# Patient Record
Sex: Female | Born: 1990 | Race: Black or African American | Hispanic: No | Marital: Single | Smoking: Never smoker
Health system: Southern US, Community
[De-identification: ages and names within clinical notes are randomized; demographics above are authoritative.]

## PROBLEM LIST (undated history)

## (undated) ENCOUNTER — Inpatient Hospital Stay (HOSPITAL_COMMUNITY): Payer: Self-pay

## (undated) DIAGNOSIS — K219 Gastro-esophageal reflux disease without esophagitis: Secondary | ICD-10-CM

## (undated) HISTORY — PX: NO PAST SURGERIES: SHX2092

---
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
## 2014-06-29 ENCOUNTER — Ambulatory Visit (INDEPENDENT_AMBULATORY_CARE_PROVIDER_SITE_OTHER): Payer: BC Managed Care – HMO | Admitting: Family Medicine

## 2014-06-29 ENCOUNTER — Encounter: Payer: Self-pay | Admitting: Family Medicine

## 2014-06-29 VITALS — BP 98/62 | HR 93 | Temp 99.4°F | Resp 16 | Ht 59.5 in | Wt 115.8 lb

## 2014-06-29 DIAGNOSIS — R109 Unspecified abdominal pain: Secondary | ICD-10-CM

## 2014-06-29 DIAGNOSIS — N898 Other specified noninflammatory disorders of vagina: Secondary | ICD-10-CM

## 2014-06-29 DIAGNOSIS — R350 Frequency of micturition: Secondary | ICD-10-CM

## 2014-06-29 DIAGNOSIS — B9689 Other specified bacterial agents as the cause of diseases classified elsewhere: Secondary | ICD-10-CM

## 2014-06-29 DIAGNOSIS — N760 Acute vaginitis: Secondary | ICD-10-CM

## 2014-06-29 DIAGNOSIS — A499 Bacterial infection, unspecified: Secondary | ICD-10-CM

## 2014-06-29 DIAGNOSIS — N390 Urinary tract infection, site not specified: Secondary | ICD-10-CM

## 2014-06-29 DIAGNOSIS — Z7251 High risk heterosexual behavior: Secondary | ICD-10-CM

## 2014-06-29 DIAGNOSIS — Z0000 Encounter for general adult medical examination without abnormal findings: Secondary | ICD-10-CM | POA: Insufficient documentation

## 2014-06-29 LAB — POCT WET PREP WITH KOH
KOH Prep POC: NEGATIVE
TRICHOMONAS UA: NEGATIVE
Yeast Wet Prep HPF POC: NEGATIVE

## 2014-06-29 LAB — POCT URINALYSIS DIPSTICK
BILIRUBIN UA: NEGATIVE
GLUCOSE UA: NEGATIVE
KETONES UA: NEGATIVE
Nitrite, UA: NEGATIVE
Protein, UA: NEGATIVE
SPEC GRAV UA: 1.02
UROBILINOGEN UA: 0.2
pH, UA: 5.5

## 2014-06-29 LAB — POCT UA - MICROSCOPIC ONLY
Casts, Ur, LPF, POC: NEGATIVE
Crystals, Ur, HPF, POC: NEGATIVE
Mucus, UA: POSITIVE
Yeast, UA: NEGATIVE

## 2014-06-29 LAB — POCT URINE PREGNANCY: PREG TEST UR: NEGATIVE

## 2014-06-29 MED ORDER — NITROFURANTOIN MONOHYD MACRO 100 MG PO CAPS: 100.0000 mg | ORAL_CAPSULE | Freq: Two times a day (BID) | ORAL | Status: AC

## 2014-06-29 MED ORDER — METRONIDAZOLE 0.75 % VA GEL: 1.0000 | Freq: Two times a day (BID) | VAGINAL | Status: DC

## 2014-06-29 NOTE — Patient Instructions (Signed)
Call the walk-in office to see when Monica Armstrong is working- [phone]. Come in for your PAP exam.  You need to return later today to have labs drawn; please drink as much water as you can.  Medications have been prescribed for UTI and bacterial vaginosis.    HPV Vaccine Gardasil (Human Papillomavirus): What You Need to Know 1. What is HPV? Genital human papillomavirus (HPV) is the most common sexually transmitted virus in the [place]. More than half of sexually active men and women are infected with HPV at some time in their lives. About 20 million Americans are currently infected, and about 6 million more get infected each year. HPV is usually spread through sexual contact. Most HPV infections don't cause any symptoms, and go away on their own. But HPV can cause cervical cancer in women. Cervical cancer is the 2nd leading cause of cancer deaths among women around the world. In the [place], about 12,000 women get cervical cancer every year and about 4,000 are expected to die from it. HPV is also associated with several less common cancers, such as vaginal and vulvar cancers in women, and anal and oropharyngeal (back of the throat, including base of tongue and tonsils) cancers in both men and women. HPV can also cause genital warts and warts in the throat. There is no cure for HPV infection, but some of the problems it causes can be treated. 2. HPV vaccine: Why get vaccinated? The HPV vaccine you are getting is one of two vaccines that can be given to prevent HPV. It may be given to both males and females.  This vaccine can prevent most cases of cervical cancer in females, if it is given before exposure to the virus. In addition, it can prevent vaginal and vulvar cancer in females, and genital warts and anal cancer in both males and females. Protection from HPV vaccine is expected to be long-lasting. But vaccination is not a substitute for cervical cancer screening. Women should  still get regular Pap tests. 3. Who should get this HPV vaccine and when? HPV vaccine is given as a 3-dose series  1st Dose: Now  2nd Dose: 1 to 2 months after Dose 1  3rd Dose: 6 months after Dose 1 Additional (booster) doses are not recommended. Routine vaccination  This HPV vaccine is recommended for girls and boys 80 or 21 years of age. It may be given starting at age 50. Why is HPV vaccine recommended at 66 or 21 years of age?  HPV infection is easily acquired, even with only one sex partner. That is why it is important to get HPV vaccine before any sexual contact takes place. Also, response to the vaccine is better at this age than at older ages. Catch-up vaccination This vaccine is recommended for the following people who have not completed the 3-dose series:   Females 13 through 21 years of age.  Males 13 through 21 years of age. This vaccine may be given to men 22 through 21 years of age who have not completed the 3-dose series. It is recommended for men through age 48 who have sex with men or whose immune system is weakened because of HIV infection, other illness, or medications.  HPV vaccine may be given at the same time as other vaccines. 4. Some people should not get HPV vaccine or should wait.  Anyone who has ever had a life-threatening allergic reaction to any component of HPV vaccine, or to a previous dose of HPV vaccine,  should not get the vaccine. Tell your doctor if the person getting vaccinated has any severe allergies, including an allergy to yeast.  HPV vaccine is not recommended for pregnant women. However, receiving HPV vaccine when pregnant is not a reason to consider terminating the pregnancy. Women who are breast feeding may get the vaccine.  People who are mildly ill when a dose of HPV is planned can still be vaccinated. People with a moderate or severe illness should wait until they are better. 5. What are the risks from this vaccine? This HPV vaccine has  been used in the [place] and around the world for about six years and has been very safe. However, any medicine could possibly cause a serious problem, such as a severe allergic reaction. The risk of any vaccine causing a serious injury, or death, is extremely small. Life-threatening allergic reactions from vaccines are very rare. If they do occur, it would be within a few minutes to a few hours after the vaccination. Several mild to moderate problems are known to occur with this HPV vaccine. These do not last long and go away on their own.  Reactions in the arm where the shot was given:  Pain (about 8 people in 10)  Redness or swelling (about 1 person in 4)  Fever:  Mild (100 F) (about 1 person in 10)  Moderate (102 F) (about 1 person in 3865)  Other problems:  Headache (about 1 person in 3)  Fainting: Brief fainting spells and related symptoms (such as jerking movements) can happen after any medical procedure, including vaccination. Sitting or lying down for about 15 minutes after a vaccination can help prevent fainting and injuries caused by falls. Tell your doctor if the patient feels dizzy or light-headed, or has vision changes or ringing in the ears.  Like all vaccines, HPV vaccines will continue to be monitored for unusual or severe problems. 6. What if there is a serious reaction? What should I look for?  Look for anything that concerns you, such as signs of a severe allergic reaction, very high fever, or behavior changes. Signs of a severe allergic reaction can include hives, swelling of the face and throat, difficulty breathing, a fast heartbeat, dizziness, and weakness. These would start a few minutes to a few hours after the vaccination.  What should I do?  If you think it is a severe allergic reaction or other emergency that can't wait, call 9-1-1 or get the person to the nearest hospital. Otherwise, call your doctor.  Afterward, the reaction should be reported to the  Vaccine Adverse Event Reporting System (VAERS). Your doctor might file this report, or you can do it yourself through the VAERS web site at www.vaers.LAgents.nohhs.gov, or by calling 1-351-404-4014. VAERS is only for reporting reactions. They do not give medical advice. 7. The National Vaccine Injury Compensation Program  The [employer] Vaccine Injury Compensation Program (VICP) is a federal program that was created to compensate people who may have been injured by certain vaccines.  Persons who believe they may have been injured by a vaccine can learn about the program and about filing a claim by calling 1-281 338 0441 or visiting the VICP website at SpiritualWord.atwww.hrsa.gov/vaccinecompensation. 8. How can I learn more?  Ask your doctor.  Call your local or state health department.  Contact the Centers for Disease Control and Prevention (CDC):  Call (480)316-83921-(516)824-1310 (1-800-CDC-INFO)  or  Visit CDC's website at PicCapture.uywww.cdc.gov/vaccines CDC Human Papillomavirus (HPV) Gardasil (Interim) 12/26/11 Document Released: 05/25/2006 Document Revised: 12/12/2013  Document Reviewed: 09/08/2013 [facility] Patient Information 2015 [place], [place]. This information is not intended to replace advice given to you by your health care provider. Make sure you discuss any questions you have with your health care provider.    Bacterial Vaginosis Bacterial vaginosis is an infection of the vagina. It happens when too many of certain germs (bacteria) grow in the vagina. HOME CARE  Take your medicine as told by your doctor.  Finish your medicine even if you start to feel better.  Do not have sex until you finish your medicine and are better.  Tell your sex partner that you have an infection. They should see their doctor for treatment.  Practice safe sex. Use condoms. Have only one sex partner. GET HELP IF:  You are not getting better after 3 days of treatment.  You have more grey fluid (discharge) coming from your vagina than  before.  You have more pain than before.  You have a fever. MAKE SURE YOU:   Understand these instructions.  Will watch your condition.  Will get help right away if you are not doing well or get worse. Document Released: 05/06/2008 Document Revised: 05/18/2013 Document Reviewed: 03/09/2013 [facility] Patient Information 2015 [place]Care, [place]. This information is not intended to replace advice given to you by your health care provider. Make sure you discuss any questions you have with your health care provider.

## 2014-06-29 NOTE — Progress Notes (Signed)
S:  This [age] y.o. AA female is her e to establish care today. Lanier ClamNicole Bush, PA-C conducted visit today. Documentation of HPI is accurate.   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 She lives w/ her mother.  O: I have reviewed documentation of physical exam and agree w/ findings per N. Bush, PA-C.  A/P: High risk sexual behavior - Plan: POCT urine pregnancy, RPR, HIV antibody, Hepatitis C Ab Reflex HCV RNA, QUANT, GC/Chlamydia Probe Amp  Vaginal discharge - Plan: RPR, HIV antibody, Hepatitis C Ab Reflex HCV RNA, QUANT, POCT Wet Prep with KOH, GC/Chlamydia Probe Amp  Frequent urination - Plan: POCT UA - Microscopic Only, POCT urinalysis dipstick  Abdominal pain, unspecified abdominal location - Plan: CBC with Differential, Comprehensive metabolic panel  UTI (lower urinary tract infection) - Pt thinks she had a reaction to SULFA in the past. Plan: nitrofurantoin, macrocrystal-monohydrate, (MACROBID) 100 MG capsule  Bacterial vaginosis - Plan: metroNIDAZOLE (METROGEL VAGINAL) 0.75 % vaginal gel- Use 1 applicatorful at bedtime for 7 nights.  Dory HornB. Taran Hable, MD [facility]

## 2014-06-29 NOTE — Progress Notes (Signed)
Subjective:    Patient ID: Monica Armstrong, female    DOB: [birth year], 23 y.o.   MRN: [id number] There are no active problems to display for this patient.  Marland Kitchen. HPI  This is a 23 year old female presenting with malodorous urine and intermittent sharp abdominal pain x 2 months. Abdominal pain occurs once every other day. She has had some vaginal discharge that is sticky and white. She had a yeast infection and UTI in June and was treated for both. She is having some slight vulvar itching, but not as bad as in June. Her LMP was the beginning of October. She reports she missed her period in September and she is late for her period this month. She was last sexually active in August. She has never been pregnant before. She denies fever, chills, N/V, or hematuria. She does admit to some breast tenderness. She reports she had an STD about a year ago and was treated. She can't recall if she has had a pap before. She has never had the gardasil series.  She is not currently using birth control. She is unable to take a pill consistently which makes the pill an ineffective option. She has tried depo and had side effects. She was on nuvaring at one point but her dad made her stop    Review of Systems  Constitutional: Negative for fever and chills.  HENT: Negative for sore throat.   Gastrointestinal: Positive for abdominal pain. Negative for nausea and vomiting.  Genitourinary: Positive for menstrual problem. Negative for hematuria and genital sores.  Skin: Negative.       Objective:   Physical Exam  Constitutional: She is oriented to person, place, and time. She appears well-developed and well-nourished. No distress.  HENT:  Head: Normocephalic and atraumatic.  Right Ear: Hearing normal.  Left Ear: Hearing normal.  Mouth/Throat: Uvula is midline, oropharynx is clear and moist and mucous membranes are normal.  Eyes: Conjunctivae and lids are normal. Right eye exhibits no discharge. Left eye exhibits no  discharge. No scleral icterus.  Cardiovascular: Normal rate, regular rhythm, normal heart sounds, intact distal pulses and normal pulses.   No murmur heard. Pulmonary/Chest: Effort normal and breath sounds normal. She has no wheezes. She has no rhonchi. She has no rales.  Abdominal: Soft. Normal appearance. There is tenderness in the epigastric area, suprapubic area and left upper quadrant. There is no rigidity, no rebound, no guarding and no CVA tenderness.  Genitourinary: Uterus normal. There is no rash or lesion on the right labia. There is no rash or lesion on the left labia. Uterus is not enlarged and not tender. Cervix exhibits no motion tenderness, no discharge and no friability. Right adnexum displays no mass, no tenderness and no fullness. Left adnexum displays no mass, no tenderness and no fullness. No tenderness in the vagina. Vaginal discharge (thick, white) found.  Lymphadenopathy:       Head (right side): No submental, no submandibular, no tonsillar, no preauricular, no posterior auricular and no occipital adenopathy present.       Head (left side): No submental, no submandibular, no tonsillar, no preauricular, no posterior auricular and no occipital adenopathy present.    She has no cervical adenopathy.  Neurological: She is alert and oriented to person, place, and time.  Skin: Skin is warm, dry and intact. No lesion and no rash noted.  Psychiatric: She has a normal mood and affect. Her speech is normal and behavior is normal. Thought content normal.  Results for orders placed or performed in visit on 06/29/14  POCT urine pregnancy  Result Value Ref Range   Preg Test, Ur Negative   POCT UA - Microscopic Only  Result Value Ref Range   WBC, Ur, HPF, POC tntc    RBC, urine, microscopic tntc    Bacteria, U Microscopic 4++    Mucus, UA positive    Epithelial cells, urine per micros 2-15    Crystals, Ur, HPF, POC neg    Casts, Ur, LPF, POC neg    Yeast, UA neg   POCT urinalysis  dipstick  Result Value Ref Range   Color, UA yellow    Clarity, UA cloudy    Glucose, UA neg    Bilirubin, UA neg    Ketones, UA neg    Spec Grav, UA 1.020    Blood, UA trace-lysed    pH, UA 5.5    Protein, UA neg    Urobilinogen, UA 0.2    Nitrite, UA neg    Leukocytes, UA small (1+)   POCT Wet Prep with KOH  Result Value Ref Range   Trichomonas, UA Negative    Clue Cells Wet Prep HPF POC 0-6    Epithelial Wet Prep HPF POC 2-9    Yeast Wet Prep HPF POC neg    Bacteria Wet Prep HPF POC 4+    RBC Wet Prep HPF POC 0-2    WBC Wet Prep HPF POC 0-10    KOH Prep POC Negative        Assessment & Plan:  1. High risk sexual behavior 2. Vaginal discharge 3. Frequent urination 4. Abdominal pain Urine pregnancy test negative. Blood draw was unsuccessful - she will return later today after drinking plenty of water.  - POCT urine pregnancy - RPR - HIV antibody - Hepatitis C Ab Reflex HCV RNA, QUANT - GC/Chlamydia Probe Amp - POCT UA - Microscopic Only - POCT urinalysis dipstick - CBC with Differential - Comprehensive metabolic panel  5. UTI (lower urinary tract infection) UA with 4++ bacteria, leukocytes TNTC and rbc TNTC. Will treat with macrobid. - nitrofurantoin, macrocrystal-monohydrate, (MACROBID) 100 MG capsule; Take 1 capsule (100 mg total) by mouth 2 (two) times daily.  Dispense: 14 capsule; Refill: 0  6. Bacterial vaginosis Clue cells on wet prep and malodorous vaginal discharge make BV likely. Will treat with metrogel nightly for 7 days. - metroNIDAZOLE (METROGEL VAGINAL) 0.75 % vaginal gel; Place 1 Applicatorful vaginally 2 (two) times daily.  Dispense: 70 g; Refill: 0   She will return at her earliest convenience for a pap smear. Information on gardisil was given.  Roswell MinersNicole V. Dyke BrackettBush, PA-C, MHS [facility]  06/29/2014

## 2014-06-30 ENCOUNTER — Telehealth: Payer: Self-pay | Admitting: Family Medicine

## 2014-06-30 LAB — GC/CHLAMYDIA PROBE AMP
CT Probe RNA: NEGATIVE
GC PROBE AMP APTIMA: NEGATIVE

## 2014-06-30 NOTE — Telephone Encounter (Signed)
Spoke with patient asked if she was going to come back to get her blood drawn because earlier during the day knowone could draw her blood so patient states that she would come back in so she did I tried also twice didn't get anything so [facility]

## 2014-07-04 LAB — COMPREHENSIVE METABOLIC PANEL
ALBUMIN: 4.2 g/dL (ref 3.5–5.2)
ALK PHOS: 72 U/L (ref 39–117)
ALT: 12 U/L (ref 0–35)
AST: 16 U/L (ref 0–37)
BUN: 7 mg/dL (ref 6–23)
CO2: 27 mEq/L (ref 19–32)
Calcium: 9.3 mg/dL (ref 8.4–10.5)
Chloride: 103 mEq/L (ref 96–112)
Creat: 0.54 mg/dL (ref 0.50–1.10)
Glucose, Bld: 116 mg/dL — ABNORMAL HIGH (ref 70–99)
POTASSIUM: 3.9 meq/L (ref 3.5–5.3)
SODIUM: 137 meq/L (ref 135–145)
TOTAL PROTEIN: 7.5 g/dL (ref 6.0–8.3)
Total Bilirubin: 0.3 mg/dL (ref 0.2–1.2)

## 2014-07-04 LAB — CBC WITH DIFFERENTIAL/PLATELET
BASOS ABS: 0 10*3/uL (ref 0.0–0.1)
Basophils Relative: 0 % (ref 0–1)
Eosinophils Absolute: 0.2 10*3/uL (ref 0.0–0.7)
Eosinophils Relative: 3 % (ref 0–5)
HEMATOCRIT: 39.3 % (ref 36.0–46.0)
Hemoglobin: 13.1 g/dL (ref 12.0–15.0)
LYMPHS PCT: 41 % (ref 12–46)
Lymphs Abs: 2.9 10*3/uL (ref 0.7–4.0)
MCH: 28.8 pg (ref 26.0–34.0)
MCHC: 33.3 g/dL (ref 30.0–36.0)
MCV: 86.4 fL (ref 78.0–100.0)
MONO ABS: 0.6 10*3/uL (ref 0.1–1.0)
MPV: 12 fL (ref 9.4–12.4)
Monocytes Relative: 8 % (ref 3–12)
NEUTROS ABS: 3.4 10*3/uL (ref 1.7–7.7)
Neutrophils Relative %: 48 % (ref 43–77)
Platelets: 276 10*3/uL (ref 150–400)
RBC: 4.55 MIL/uL (ref 3.87–5.11)
RDW: 14 % (ref 11.5–15.5)
WBC: 7 10*3/uL (ref 4.0–10.5)

## 2014-07-04 LAB — HIV ANTIBODY (ROUTINE TESTING W REFLEX): HIV: NONREACTIVE

## 2014-07-04 LAB — HEPATITIS C ANTIBODY: HCV Ab: NEGATIVE

## 2014-07-04 LAB — RPR

## 2014-07-04 NOTE — Progress Notes (Signed)
Quick Note:  Notify pt of Normal results. ______ 

## 2014-07-05 ENCOUNTER — Telehealth: Payer: Self-pay | Admitting: Family Medicine

## 2014-07-05 NOTE — Telephone Encounter (Signed)
Called and spoke with patient let her know that all her labs were normal patient didn't want a copy of her labs

## 2014-07-05 NOTE — Telephone Encounter (Signed)
-----   Message from Maurice MarchBarbara B McPherson, MD sent at 07/04/2014  6:50 PM EST ----- Please advise pt that her labs are normal and send her a copy of the results.  Thank you.

## 2014-09-08 ENCOUNTER — Ambulatory Visit (INDEPENDENT_AMBULATORY_CARE_PROVIDER_SITE_OTHER): Payer: BLUE CROSS/BLUE SHIELD | Admitting: Emergency Medicine

## 2014-09-08 VITALS — BP 110/70 | HR 88 | Temp 98.2°F | Resp 16 | Ht 59.5 in | Wt 121.0 lb

## 2014-09-08 DIAGNOSIS — Z30011 Encounter for initial prescription of contraceptive pills: Secondary | ICD-10-CM

## 2014-09-08 LAB — POCT URINE PREGNANCY: PREG TEST UR: NEGATIVE

## 2014-09-08 MED ORDER — NORGESTIM-ETH ESTRAD TRIPHASIC 0.18/0.215/0.25 MG-35 MCG PO TABS: 1.0000 | ORAL_TABLET | Freq: Every day | ORAL | Status: DC

## 2014-09-08 NOTE — Progress Notes (Signed)
[facility] [address], [place] [id number] [phone]  Date:  09/08/2014   Name:  Monica Armstrong   DOB:  04-Jun-1991   MRN:  [id number]  PCP:  No Pcp Per Pt    Chief Complaint: Contraception   History of Present Illness:  Monica Armstrong is a 24 y.o. very pleasant female patient who presents with the following:  Requests OCP prescription.  Had a nuva ring and then depo and wants pill History of PID LMP 08/30/14 No intercourse since period Denies other complaint or health concern today.   Patient Active Problem List   Diagnosis Date Noted  . Health care maintenance 06/29/2014    History reviewed. No pertinent past medical history.  History reviewed. No pertinent past surgical history.  History  Substance Use Topics  . Smoking status: Never Smoker   . Smokeless tobacco: Not on file  . Alcohol Use: No    Family History  Problem Relation Age of Onset  . Heart disease Paternal Grandmother     No Known Allergies  Medication list has been reviewed and updated.  No current outpatient prescriptions on file prior to visit.   No current facility-administered medications on file prior to visit.    Review of Systems:  As per HPI, otherwise negative.    Physical Examination: Filed Vitals:   09/08/14 1149  BP: 110/70  Pulse: 88  Temp: 98.2 F (36.8 C)  Resp: 16   Filed Vitals:   09/08/14 1149  Height: 4' 11.5" (1.511 m)  Weight: 121 lb (54.885 kg)   Body mass index is 24.04 kg/(m^2). Ideal Body Weight: Weight in (lb) to have BMI = 25: 125.6   GEN: WDWN, NAD, Non-toxic, Alert & Oriented x 3 HEENT: Atraumatic, Normocephalic.  Ears and Nose: No external deformity. EXTR: No clubbing/cyanosis/edema NEURO: Normal gait.  PSYCH: Normally interactive. Conversant. Not depressed or anxious appearing.  Calm demeanor.    Assessment and Plan: Contraceptive management Ortho tricyclin  Signed,  Phillips OdorJeffery Anderson, MD   Results for  orders placed or performed in visit on 09/08/14  POCT urine pregnancy  Result Value Ref Range   Preg Test, Ur Negative

## 2014-09-08 NOTE — Patient Instructions (Signed)
ocp Oral Contraception Information Oral contraceptive pills (OCPs) are medicines taken to prevent pregnancy. OCPs work by preventing the ovaries from releasing eggs. The hormones in OCPs also cause the cervical mucus to thicken, preventing the sperm from entering the uterus. The hormones also cause the uterine lining to become thin, not allowing a fertilized egg to attach to the inside of the uterus. OCPs are highly effective when taken exactly as prescribed. However, OCPs do not prevent sexually transmitted diseases (STDs). Safe sex practices, such as using condoms along with the pill, can help prevent STDs.  Before taking the pill, you may have a physical exam and Pap test. Your health care provider may order blood tests. The health care provider will make sure you are a good candidate for oral contraception. Discuss with your health care provider the possible side effects of the OCP you may be prescribed. When starting an OCP, it can take 2 to 3 months for the body to adjust to the changes in hormone levels in your body.  TYPES OF ORAL CONTRACEPTION  The combination pill--This pill contains estrogen and progestin (synthetic progesterone) hormones. The combination pill comes in 21-day, 28-day, or 91-day packs. Some types of combination pills are meant to be taken continuously (365-day pills). With 21-day packs, you do not take pills for 7 days after the last pill. With 28-day packs, the pill is taken every day. The last 7 pills are without hormones. Certain types of pills have more than 21 hormone-containing pills. With 91-day packs, the first 84 pills contain both hormones, and the last 7 pills contain no hormones or contain estrogen only.  The minipill--This pill contains the progesterone hormone only. The pill is taken every day continuously. It is very important to take the pill at the same time each day. The minipill comes in packs of 28 pills. All 28 pills contain the hormone.  ADVANTAGES OF ORAL  CONTRACEPTIVE PILLS  Decreases premenstrual symptoms.   Treats menstrual period cramps.   Regulates the menstrual cycle.   Decreases a heavy menstrual flow.   May treatacne, depending on the type of pill.   Treats abnormal uterine bleeding.   Treats polycystic ovarian syndrome.   Treats endometriosis.   Can be used as emergency contraception.  THINGS THAT CAN MAKE ORAL CONTRACEPTIVE PILLS LESS EFFECTIVE OCPs can be less effective if:   You forget to take the pill at the same time every day.   You have a stomach or intestinal disease that lessens the absorption of the pill.   You take OCPs with other medicines that make OCPs less effective, such as antibiotics, certain HIV medicines, and some seizure medicines.   You take expired OCPs.   You forget to restart the pill on day 7, when using the packs of 21 pills.  RISKS ASSOCIATED WITH ORAL CONTRACEPTIVE PILLS  Oral contraceptive pills can sometimes cause side effects, such as:  Headache.  Nausea.  Breast tenderness.  Irregular bleeding or spotting. Combination pills are also associated with a small increased risk of:  Blood clots.  Heart attack.  Stroke. Document Released: 10/18/2002 Document Revised: 05/18/2013 Document Reviewed: 01/16/2013 [facility] Patient Information 2015 [place]Care, [place]. This information is not intended to replace advice given to you by your health care provider. Make sure you discuss any questions you have with your health care provider.

## 2014-10-09 ENCOUNTER — Ambulatory Visit (INDEPENDENT_AMBULATORY_CARE_PROVIDER_SITE_OTHER): Payer: BLUE CROSS/BLUE SHIELD | Admitting: Family Medicine

## 2014-10-09 VITALS — BP 108/62 | HR 98 | Temp 97.7°F | Resp 19 | Ht 59.75 in | Wt 121.6 lb

## 2014-10-09 DIAGNOSIS — R350 Frequency of micturition: Secondary | ICD-10-CM

## 2014-10-09 DIAGNOSIS — IMO0001 Reserved for inherently not codable concepts without codable children: Secondary | ICD-10-CM

## 2014-10-09 DIAGNOSIS — N644 Mastodynia: Secondary | ICD-10-CM

## 2014-10-09 DIAGNOSIS — M791 Myalgia: Secondary | ICD-10-CM

## 2014-10-09 DIAGNOSIS — N912 Amenorrhea, unspecified: Secondary | ICD-10-CM

## 2014-10-09 DIAGNOSIS — M609 Myositis, unspecified: Secondary | ICD-10-CM

## 2014-10-09 LAB — POCT UA - MICROSCOPIC ONLY
Casts, Ur, LPF, POC: NEGATIVE
Crystals, Ur, HPF, POC: NEGATIVE
Yeast, UA: NEGATIVE

## 2014-10-09 LAB — POCT URINALYSIS DIPSTICK
Bilirubin, UA: NEGATIVE
Glucose, UA: NEGATIVE
Ketones, UA: NEGATIVE
Nitrite, UA: POSITIVE
Protein, UA: NEGATIVE
Spec Grav, UA: 1.02
Urobilinogen, UA: 1
pH, UA: 6

## 2014-10-09 LAB — POCT URINE PREGNANCY: Preg Test, Ur: NEGATIVE

## 2014-10-09 MED ORDER — CIPROFLOXACIN HCL 500 MG PO TABS: 500.0000 mg | ORAL_TABLET | Freq: Two times a day (BID) | ORAL | Status: DC

## 2014-10-09 NOTE — Progress Notes (Signed)
This chart was scribed for Elvina Sidle, MD by Luisa Dago, ED Scribe. This patient was seen in room 5 and the patient's care was started at 4:25 PM.  Patient ID: Monica Armstrong MRN: [id number], DOB: 27-Apr-1991, 24 y.o. Date of Encounter: 10/09/2014, 4:23 PM  Primary Physician: No Pcp Per Pt  Chief Complaint:  Chief Complaint  Patient presents with  . 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 Pt states that she has been having breast tenderness and lower back pain constantly now for the past 2 weeks.. She describes the pain as "achy" in nature. Pt denies any spotting, last normal menstrual period was around January 15. She states that the last time she felt this kind of pain she was diagnosed with a cyst. Monica Armstrong endorses associated increased urinary frequency. Pt denies any fever, neck pain, sore throat, visual disturbance, CP, cough, SOB, abdominal pain, nausea, emesis, diarrhea,  HA, weakness, numbness and rash as associated symptoms.      History reviewed. No pertinent past medical history.   Home Meds: Prior to Admission medications   Medication Sig Start Date End Date Taking? Authorizing Provider  Norgestimate-Ethinyl Estradiol Triphasic (ORTHO TRI-CYCLEN, 28,) 0.18/0.215/0.25 MG-35 MCG tablet Take 1 tablet by mouth daily. Patient not taking: Reported on 10/09/2014 09/08/14   Carmelina Dane, MD    Allergies: No Known Allergies  History   Social History  . Marital Status: Single    Spouse Name: N/A  . Number of Children: N/A  . Years of Education: N/A   Occupational History  . Not on file.   Social History Main Topics  . Smoking status: Never Smoker   . Smokeless tobacco: Not on file  . Alcohol Use: No  . Drug Use: Not on file  . Sexual Activity: Yes    Birth Control/ Protection: None   Other Topics Concern  . Not on file    Social History Narrative     Review of Systems: positive back pain and increased urinary frequency Constitutional: negative for chills, fever, night sweats, weight changes, or fatigue  HEENT: negative for vision changes, hearing loss, congestion, rhinorrhea, ST, epistaxis, or sinus pressure Cardiovascular: negative for chest pain or palpitations Respiratory: negative for hemoptysis, wheezing, shortness of breath, or cough Abdominal: negative for abdominal pain, nausea, vomiting, diarrhea, or constipation Dermatological: negative for rash Neurologic: negative for headache, dizziness, or syncope All other systems reviewed and are otherwise negative with the exception to those above and in the HPI.   Physical Exam:  Blood pressure 108/62, pulse 98, temperature 97.7 F (36.5 C), temperature source Oral, resp. rate 19, height 4' 11.75" (1.518 m), weight 121 lb 9.6 oz (55.157 kg), last menstrual period 10/03/2014, SpO2 100 %., Body mass index is 23.94 kg/(m^2). General: Well developed, well nourished, in no acute distress. Head: Normocephalic, atraumatic, eyes without discharge, sclera non-icteric, nares are without discharge. Bilateral auditory canals clear, TM's are without perforation, pearly grey and translucent with reflective cone of light bilaterally. Oral cavity moist, posterior pharynx without exudate, erythema, peritonsillar abscess, or post nasal drip.  Neck: Supple. No thyromegaly. Full ROM. No lymphadenopathy. Lungs: Clear bilaterally to auscultation without wheezes, rales, or rhonchi. Breathing is unlabored. Heart: RRR with S1 S2. No murmurs, rubs, or gallops appreciated. Abdomen: Soft, non-tender, non-distended with normoactive bowel sounds. No hepatomegaly. No rebound/guarding. No obvious  abdominal masses. no flank tenderness.  Msk:  Strength and tone normal for age. Extremities/Skin: Warm and dry. No clubbing or cyanosis. No edema. No rashes or suspicious lesions. Neuro: Alert  and oriented X 3. Moves all extremities spontaneously. Gait is normal. CNII-XII grossly in tact. Psych:  Responds to questions appropriately with a normal affect.   Labs: Results for orders placed or performed in visit on 10/09/14  POCT urine pregnancy  Result Value Ref Range   Preg Test, Ur Negative   POCT urinalysis dipstick  Result Value Ref Range   Color, UA yellow    Clarity, UA cloudy    Glucose, UA neg    Bilirubin, UA neg    Ketones, UA neg    Spec Grav, UA 1.020    Blood, UA trace-intact    pH, UA 6.0    Protein, UA neg    Urobilinogen, UA 1.0    Nitrite, UA positive    Leukocytes, UA small (1+)   POCT UA - Microscopic Only  Result Value Ref Range   WBC, Ur, HPF, POC 15-20    RBC, urine, microscopic 2-5    Bacteria, U Microscopic 4+    Mucus, UA ng    Epithelial cells, urine per micros 2-4    Crystals, Ur, HPF, POC neg    Casts, Ur, LPF, POC neg    Yeast, UA neg       ASSESSMENT AND PLAN:  24 y.o. year old female with UTI This chart was scribed in my presence and reviewed by me personally.    ICD-9-CM ICD-10-CM   1. Myalgia and myositis 729.1 M79.1 POCT urinalysis dipstick    M60.9 POCT UA - Microscopic Only  2. Urinary frequency 788.41 R35.0 POCT urinalysis dipstick     POCT UA - Microscopic Only     Urine culture  3. Breast tenderness 611.71 N64.4 POCT urine pregnancy     POCT urinalysis dipstick     POCT UA - Microscopic Only  4. Amenorrhea 626.0 N91.2 POCT urine pregnancy     Signed, Elvina SidleKurt Tamekia Rotter, MD   Signed, Elvina SidleKurt Anyely Cunning, MD 10/09/2014 4:23 PM

## 2014-10-11 LAB — URINE CULTURE: Colony Count: >=100000

## 2014-10-13 ENCOUNTER — Ambulatory Visit (INDEPENDENT_AMBULATORY_CARE_PROVIDER_SITE_OTHER): Payer: BLUE CROSS/BLUE SHIELD | Admitting: Family Medicine

## 2014-10-13 ENCOUNTER — Encounter: Payer: Self-pay | Admitting: Family Medicine

## 2014-10-13 VITALS — BP 108/58 | HR 87 | Temp 98.7°F | Resp 16 | Ht 59.5 in | Wt 119.8 lb

## 2014-10-13 DIAGNOSIS — N898 Other specified noninflammatory disorders of vagina: Secondary | ICD-10-CM

## 2014-10-13 DIAGNOSIS — Z01419 Encounter for gynecological examination (general) (routine) without abnormal findings: Secondary | ICD-10-CM

## 2014-10-13 DIAGNOSIS — N3000 Acute cystitis without hematuria: Secondary | ICD-10-CM

## 2014-10-13 DIAGNOSIS — Z124 Encounter for screening for malignant neoplasm of cervix: Secondary | ICD-10-CM

## 2014-10-13 DIAGNOSIS — R102 Pelvic and perineal pain: Secondary | ICD-10-CM

## 2014-10-13 DIAGNOSIS — N949 Unspecified condition associated with female genital organs and menstrual cycle: Secondary | ICD-10-CM

## 2014-10-13 LAB — POCT WET PREP WITH KOH
KOH Prep POC: NEGATIVE
RBC Wet Prep HPF POC: NEGATIVE
Trichomonas, UA: NEGATIVE
WBC WET PREP PER HPF POC: NEGATIVE
Yeast Wet Prep HPF POC: NEGATIVE

## 2014-10-13 NOTE — Progress Notes (Signed)
Subjective:    Patient ID: Monica Armstrong, female    DOB: 1991-01-12, 24 y.o.   MRN: [id number]  HPI This 24 y.o. Female is here for first pelvic with PAP. She takes OCPs prescribed in late Jan 2016 for regulation of menses and contraception. She is consistent w/ pill administration and denies adverse effects. Pt is not sure about receiving Gardisil vaccine. She will contact her father in [place] to get vaccination record. 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   Pt currently taking Cipro for UTI; culture + for E. Coli. No fever/chills, fatigue, anorexia, n/v, back pain, urinary frequency, dysuria, hematuria or incontinence.   Prior to Admission medications   Medication Sig Start Date End Date Taking? Authorizing Provider  ciprofloxacin (CIPRO) 500 MG tablet Take 1 tablet (500 mg total) by mouth 2 (two) times daily. 10/09/14  Yes Elvina Sidle, MD  Norgestimate-Ethinyl Estradiol Triphasic (ORTHO TRI-CYCLEN, 28,) 0.18/0.215/0.25 MG-35 MCG tablet Take 1 tablet by mouth daily. 09/08/14  Yes Carmelina Dane, MD    History reviewed. No pertinent past surgical history.  Family History  Problem Relation Age of Onset  . Heart disease Paternal Grandmother     Review of Systems  Constitutional: Negative.   Eyes: Negative for visual disturbance.  Respiratory: Negative for chest tightness and shortness of breath.   Cardiovascular: Negative for chest pain, palpitations and leg swelling.  Gastrointestinal: Negative.   Genitourinary: Negative.   Neurological: Positive for dizziness. Negative for headaches.  Psychiatric/Behavioral: Negative.       Objective:   Physical Exam  Constitutional: She is oriented to person, place, and time. She appears well-developed and well-nourished. No distress.  Blood pressure 108/58, pulse 87, temperature 98.7 F (37.1 C), temperature source Oral, resp. rate 16, height 4' 11.5" (1.511 m), weight 119 lb 12.8 oz  (54.341 kg), last menstrual period 10/03/2014, SpO2 100 %.   HENT:  Head: Normocephalic and atraumatic.  Eyes: Conjunctivae and EOM are normal. No scleral icterus.  Cardiovascular: Normal rate and regular rhythm.   Pulmonary/Chest: Effort normal. No respiratory distress.  Genitourinary: Uterus normal. There is no rash, tenderness or lesion on the right labia. There is no rash, tenderness or lesion on the left labia. Cervix exhibits discharge. Cervix exhibits no motion tenderness and no friability. Right adnexum displays tenderness. Right adnexum displays no mass and no fullness. Left adnexum displays tenderness. Left adnexum displays no mass and no fullness. No erythema, tenderness or bleeding in the vagina. No signs of injury around the vagina. Vaginal discharge found.  Suprapubic tenderness (pt taking antibiotic for UTI).  Musculoskeletal: Normal range of motion. She exhibits no edema.  Neurological: She is alert and oriented to person, place, and time. No cranial nerve deficit. Coordination normal.  Skin: Skin is warm and dry. No rash noted. She is not diaphoretic. No erythema.  Psychiatric: She has a normal mood and affect. Her behavior is normal. Judgment and thought content normal.  Nursing note and vitals reviewed.   Results for orders placed or performed in visit on 10/13/14  POCT Wet Prep with KOH  Result Value Ref Range   Trichomonas, UA Negative    Clue Cells Wet Prep HPF POC 1-2    Epithelial Wet Prep HPF POC 1-4    Yeast Wet Prep HPF POC neg    Bacteria Wet Prep HPF POC 4+    RBC Wet Prep HPF POC neg    WBC Wet Prep HPF  POC neg    KOH Prep POC Negative        Assessment & Plan:  Encounter for cervical Pap smear with pelvic exam - Plan: Pap IG, CT/NG w/ reflex HPV when ASC-U  Vaginal discharge - Some indicators of BV; will await results of PAP. Plan: POCT Wet Prep with KOH  Tenderness of female pelvic organs- This was pt's first pelvic w/ PAP; she has no symptoms or  complaints of pelvic pain prior to exam.  Acute cystitis without hematuria

## 2014-10-13 NOTE — Patient Instructions (Addendum)
The vaginal discharge is not significant. Sometimes, the person looking at the PAP can see more evidence of Bacterial vaginosis. You were treated for this before. I am not going to prescribe any medication at this time but will see what shows up on the PAP results. Continue your pills and finfish the antibiotic prescribed for the urinary tract/ bladder infection.  You have enough pills for one year. Your PAP results will take about 1 week to be finalized. Try to get that information about your immunizations so you can decide if you want to get the Gardisil vaccine series.   HPV Vaccine Cervarix (Human Papillomavirus): What You Need to Know (Also called Gardisil) 1. What is HPV? Genital human papillomavirus (HPV) is the most common sexually transmitted virus in the [place]. More than half of sexually active men and women are infected with HPV at some time in their lives. About 20 million Americans are currently infected, and about 6 million more get infected each year. HPV is usually spread through sexual contact. Most HPV infections don't cause any symptoms, and go away on their own. But HPV can cause cervical cancer in women. Cervical cancer is the 2nd leading cause of cancer deaths among women around the world. In the [place], about 10,000 women get cervical cancer every year and about 4,000 are expected to die from it. HPV is also associated with several less common cancers, such as vaginal and vulvar cancers in women and other types of cancer in both men and women. It can also cause genital warts and warts in the throat. There is no cure for HPV infection, but some of the problems it causes can be treated. 2. HPV vaccine: Why get vaccinated? HPV vaccine is important because it can prevent most cases of cervical cancer in females, if it is given before a person is exposed to the virus. Protection from HPV vaccine is expected to be long-lasting. But vaccination is not a substitute for  cervical cancer screening. Women should still get regular Pap tests. The vaccine you are getting is one of two HPV vaccines that can be given to prevent cervical cancer. It is given to females only. The other vaccine may be given to both males and females. It can also prevent most genital warts. It has also been shown to prevent some vaginal, vulvar and anal cancers. 3. Who should get this HPV vaccine and when? Routine vaccination  HPV vaccine is recommended for girls 88 or 22 years of age. It may be given to girls starting at age 26. Why is HPV vaccine given to girls at this age? It is important for girls to get HPV vaccine before their first sexual contact--because they won't have been exposed to human papillomavirus. Once a girl or woman has been infected with the virus, the vaccine might not work as well or might not work at all. Catch-up vaccination  The vaccine is also recommended for girls and women 3 through 22 years of age who did not get all 3 doses when they were younger. HPV vaccine is given as a 3-dose series  1st Dose: Now  2nd Dose: 1 to 2 months after Dose 1  3rd Dose: 6 months after Dose 1 Additional (booster) doses are not recommended. HPV vaccine may be given at the same time as other vaccines. 4. Some people should not get HPV vaccine or should wait  Anyone who has ever had a life-threatening allergic reaction to any component of HPV vaccine, or  to a previous dose of HPV vaccine, should not get the vaccine. Tell your doctor if the person getting vaccinated has any severe allergies, including an allergy to latex.  HPV vaccine is not recommended for pregnant women. However, receiving HPV vaccine when pregnant is not a reason to consider terminating the pregnancy. Women who are breast feeding may get the vaccine.  Any woman who learns she was pregnant when she got this HPV vaccine is encouraged to contact the manufacturer's HPV in pregnancy registry at [phone]. This  will help [place] learn how pregnant women respond to the vaccine.  People who are mildly ill when a dose of HPV is planned can still be vaccinated. People with a moderate or severe illness should wait until they are better. 5. What are the risks from this vaccine? This HPV vaccine has been in use around the world for several years and has been very safe. However, any medicine could possibly cause a serious problem, such as a severe allergic reaction. The risk of any vaccine causing a serious injury, or death, is extremely small. Life-threatening allergic reactions from vaccines are very rare. If they do occur, it would be within a few minutes to a few hours after the vaccination. Several mild to moderate problems are known to occur with HPV vaccine. These do not last long and go away on their own.  Reactions where the shot was given:  Pain (about 9 people in 10)  Redness or swelling (about 1 person in 2)  Other mild reactions:  Fever of 99.71F or higher (about 1 person in 8)  Headache or fatigue (about 1 person in 2)  Nausea, vomiting, diarrhea, or abdominal pain (about 1 person in 4)  Muscle or joint pain (up to 1 person in 2)  Fainting:  Brief fainting spells and related symptoms (such as jerking movements) can happen after any medical procedure, including vaccination. Sitting or lying down for about 15 minutes after a vaccination can help prevent fainting and injuries caused by falls. Tell your doctor if the patient feels dizzy or light-headed, or has vision changes or ringing in the ears.  Like all vaccines, HPV vaccines will continue to be monitored for unusual or severe problems. 6. What if there is a serious reaction? What should I look for?  Look for anything that concerns you, such as signs of a severe allergic reaction, very high fever, or behavior changes. Signs of a severe allergic reaction can include hives, swelling of the face and throat, difficulty breathing, a fast  heartbeat, dizziness, and weakness. These would start a few minutes to a few hours after the vaccination.  What should I do?  If you think it is a severe allergic reaction or other emergency that can't wait, call 9-1-1 or get the person to the nearest hospital. Otherwise, call your doctor.  Afterward, the reaction should be reported to the Vaccine Adverse Event Reporting System (VAERS). Your doctor might file this report, or you can do it yourself through the VAERS web site at www.vaers.LAgents.no, or by calling 1-(515)661-1752. VAERS is only for reporting reactions. They do not give medical advice. 7. The National Vaccine Injury Compensation Program The [employer] Vaccine Injury Compensation Program (VICP) is a federal program that was created to compensate people who may have been injured by certain vaccines. Persons who believe they may have been injured by a vaccine can learn about the program and about filing a claim by calling 1-718-881-5162 or visiting the VICP website at SpiritualWord.at.  8. How can I learn more?  Ask your doctor.  Call your local or state health department.  Contact the Centers for Disease Control and Prevention (CDC):  Call 334-373-48381-561-805-8239 (1-800-CDC-INFO) or  Visit CDC's website at  PicCapture.uywww.cdc.gov/vaccines CDC Human Papillomavirus (HPV) Vaccine Cervarix VIS (12/11/09) Document Released: 12/14/2008 Document Revised: 12/12/2013 Document Reviewed: 09/08/2013 [facility] Patient Information 2015 [employer]. This information is not intended to replace advice given to you by your health care provider. Make sure you discuss any questions you have with your health care provider.

## 2014-10-17 LAB — PAP IG, CT-NG, RFX HPV ASCU
Chlamydia Probe Amp: NEGATIVE
GC Probe Amp: NEGATIVE

## 2014-10-17 NOTE — Progress Notes (Signed)
Quick Note:  Notify pt of Normal results. ______ 

## 2014-11-09 ENCOUNTER — Telehealth: Payer: Self-pay | Admitting: Family Medicine

## 2014-11-09 NOTE — Telephone Encounter (Signed)
Patient request for Dr. Audria NineMcPherson to [facility]. Patient request for forms to be completed by 11/10/2014. I will place the forms on the providers desk at 104. Please call patient when forms are ready. [phone]

## 2014-11-10 ENCOUNTER — Ambulatory Visit (INDEPENDENT_AMBULATORY_CARE_PROVIDER_SITE_OTHER): Payer: BLUE CROSS/BLUE SHIELD | Admitting: Family Medicine

## 2014-11-10 VITALS — BP 102/68 | HR 95 | Temp 98.1°F | Resp 18 | Ht 59.5 in | Wt 127.2 lb

## 2014-11-10 DIAGNOSIS — Z021 Encounter for pre-employment examination: Secondary | ICD-10-CM | POA: Diagnosis not present

## 2014-11-10 NOTE — Progress Notes (Signed)
[facility] [address], [place] [address] [phone]  Date:  11/10/2014   Name:  Monica Armstrong   DOB:  Jan 19, 1991   MRN:  [id number]  PCP:  No Pcp Per Pt    Chief Complaint: Employment Physical   History of Present Illness:  Monica Armstrong is a 24 y.o. 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  She is geneally in good health. She does exercise some- she likes jogging.  She has read through the agility test and thinks she will be able to do it ok. She also notes that about 8 months ago while living [place] she was dx with ovarian cyst- told that she would need a follow-up US around this time.  Would like to establish with OBG here in town   Patient Active Problem List   Diagnosis Date Noted  . Health care maintenance 06/29/2014    History reviewed. No pertinent past medical history.  History reviewed. No pertinent past surgical history.  History  Substance Use Topics  . Smoking status: Never Smoker   . Smokeless tobacco: Not on file  . Alcohol Use: No    Family History  Problem Relation Age of Onset  . Heart disease Paternal Grandmother     No Known Allergies  Medication list has been reviewed and updated.  Current Outpatient Prescriptions on File Prior to Visit  Medication Sig Dispense Refill  . Norgestimate-Ethinyl Estradiol Triphasic (ORTHO TRI-CYCLEN, 28,) 0.18/0.215/0.25 MG-35 MCG tablet Take 1 tablet by mouth daily. 1 Package 11  . ciprofloxacin (CIPRO) 500 MG tablet Take 1 tablet (500 mg total) by mouth 2 (two) times daily. (Patient not taking: Reported on 11/10/2014) 10 tablet 0   No current facility-administered medications on file prior to visit.    Review of Systems:  As per HPI- otherwise negative.   Physical Examination: Filed Vitals:   11/10/14 1647  BP: 102/68  Pulse: 95  Temp: 98.1 F  (36.7 C)  Resp: 18   Filed Vitals:   11/10/14 1647  Height: 4' 11.5" (1.511 m)  Weight: 127 lb 3.2 oz (57.698 kg)   Body mass index is 25.27 kg/(m^2). Ideal Body Weight: Weight in (lb) to have BMI = 25: 125.6  GEN: WDWN, NAD, Non-toxic, A & O x 3, petite build HEENT: Atraumatic, Normocephalic. Neck supple. No masses, No LAD.  Bilateral TM wnl, oropharynx normal.  PEERL,EOMI.   Ears and Nose: No external deformity. CV: RRR, No M/G/R. No JVD. No thrill. No extra heart sounds. PULM: CTA B, no wheezes, crackles, rhonchi. No retractions. No resp. distress. No accessory muscle use. ABD: S, NT, ND, +BS. No rebound. No HSM. EXTR: No c/c/e NEURO Normal gait. Normal strength and DTR all extremities  PSYCH: Normally interactive. Conversant. Not depressed or anxious appearing.  Calm demeanor.   Normal color vision, depth 4/9.  Normal hearing, vision as per vision screening tab Assessment and Plan: Physical exam, pre-employment  Completed physical form for the PD (to do physical agility test).  She plans to see OBG for the rest of her health maint, declines labs today  Signed Abbe AmsterdamJessica Grafton Warzecha, MD

## 2014-11-10 NOTE — Patient Instructions (Addendum)
Good to see you today- best of luck with your PT test You should establish with an OB-GYN here in town- there are several good Insurance claims handleroffices including  [facility]

## 2014-11-10 NOTE — Telephone Encounter (Signed)
I looked at forms and pt actually has to have a CPE.  Pt will go to 102 for CPE.

## 2014-11-13 NOTE — Addendum Note (Signed)
Addended by: Abbe AmsterdamOPLAND, Dillinger Aston C on: 11/13/2014 08:45 PM   Modules accepted: Level of Service

## 2015-01-12 ENCOUNTER — Ambulatory Visit (INDEPENDENT_AMBULATORY_CARE_PROVIDER_SITE_OTHER): Payer: BLUE CROSS/BLUE SHIELD | Admitting: Family Medicine

## 2015-01-12 VITALS — BP 110/68 | HR 68 | Temp 98.7°F | Resp 17 | Ht 59.0 in | Wt 120.0 lb

## 2015-01-12 DIAGNOSIS — B373 Candidiasis of vulva and vagina: Secondary | ICD-10-CM | POA: Diagnosis not present

## 2015-01-12 DIAGNOSIS — Z711 Person with feared health complaint in whom no diagnosis is made: Secondary | ICD-10-CM

## 2015-01-12 DIAGNOSIS — N760 Acute vaginitis: Secondary | ICD-10-CM

## 2015-01-12 DIAGNOSIS — L309 Dermatitis, unspecified: Secondary | ICD-10-CM

## 2015-01-12 DIAGNOSIS — N898 Other specified noninflammatory disorders of vagina: Secondary | ICD-10-CM | POA: Diagnosis not present

## 2015-01-12 DIAGNOSIS — B9689 Other specified bacterial agents as the cause of diseases classified elsewhere: Secondary | ICD-10-CM

## 2015-01-12 DIAGNOSIS — B3731 Acute candidiasis of vulva and vagina: Secondary | ICD-10-CM

## 2015-01-12 DIAGNOSIS — A499 Bacterial infection, unspecified: Secondary | ICD-10-CM | POA: Diagnosis not present

## 2015-01-12 LAB — POCT WET PREP WITH KOH
Clue Cells Wet Prep HPF POC: 75
KOH Prep POC: POSITIVE
RBC Wet Prep HPF POC: NEGATIVE
Trichomonas, UA: NEGATIVE
Yeast Wet Prep HPF POC: POSITIVE

## 2015-01-12 MED ORDER — METRONIDAZOLE 500 MG PO TABS: 500.0000 mg | ORAL_TABLET | Freq: Two times a day (BID) | ORAL | Status: DC

## 2015-01-12 MED ORDER — FLUCONAZOLE 150 MG PO TABS: 150.0000 mg | ORAL_TABLET | Freq: Once | ORAL | Status: DC

## 2015-01-12 NOTE — Progress Notes (Signed)
  Subjective:  Patient ID: Monica Armstrong, female    DOB: March 28, 1991  Age: 24 y.o. MRN: [id number]  24 year old lady who has a one-week history of vaginal discharge and a rash along her labia. She has itching. The discharge is whitish. She has not had a lot of trouble with vaginitis in the past, though has had it. She has a history of Chlamydia wants. Her last STD testing was last year. She has been sexually involved up until about a month ago. Always uses condoms when she does have sex. [place] She is to be abdomen and review for a job this morning. 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   Objective:   Pleasant young lady in no major distress. Abdomen soft without masses or tenderness. Last menstrual cycle was 2 weeks ago. Her labia have a little erythema to them. No blistering. She has a fairly profuse whitish thick discharge coming out of the vagina. Vaginal mucosa has some clumping and some thenar creamy white discharge. GC chlamydia culture was taken. Wet prep done.     Assessment & Plan:   Assessment: Vaginitis Labial dermatitis Risk of STDs  Plan: Results for orders placed or performed in visit on 01/12/15  POCT Wet Prep with KOH  Result Value Ref Range   Trichomonas, UA Negative    Clue Cells Wet Prep HPF POC 75%    Epithelial Wet Prep HPF POC 15-20    Yeast Wet Prep HPF POC pos    Bacteria Wet Prep HPF POC 3+    RBC Wet Prep HPF POC neg    WBC Wet Prep HPF POC 1-3    KOH Prep POC Positive     Results are being called to the patient Patient Instructions  Go ahead and leave and keep your appointment for your review  We will try and call you a little while. If you do not hear from us by about 3:00 this afternoon call  Instructions after lab review  Patient has both yeast and bacterial vaginosis. She will be treated with 2 medications. In addition to  this she is to buy some over-the-counter clotrimazole (Lotrimin) cream and rub it on her labia twice a day.  Take fluconazole (Diflucan) 150 mg one pill one dose. If symptoms continue to persist after 3 days she can take a second pill. This is for yeast in the vagina.  Take metronidazole (Flagyl) 500 mg twice daily for 1 week. This is for the bacterial vaginosis and the medicine will cause nausea and vomiting if she drinks any alcohol. No alcohol.  Return if worse or not improved  We will let her know the results of the STD tests that we did on her, testing for chlamydia and gonorrhea.       Travia Onstad, MD 01/12/2015

## 2015-01-12 NOTE — Patient Instructions (Addendum)
Go ahead and leave and keep your appointment for your review  We will try and call you a little while. If you do not hear from us by about 3:00 this afternoon call  Instructions after lab review  Patient has both yeast and bacterial vaginosis. She will be treated with 2 medications. In addition to this she is to buy some over-the-counter clotrimazole (Lotrimin) cream and rub it on her labia twice a day.  Take fluconazole (Diflucan) 150 mg one pill one dose. If symptoms continue to persist after 3 days she can take a second pill. This is for yeast in the vagina.  Take metronidazole (Flagyl) 500 mg twice daily for 1 week. This is for the bacterial vaginosis and the medicine will cause nausea and vomiting if she drinks any alcohol. No alcohol.  Return if worse or not improved  We will let her know the results of the STD tests that we did on her, testing for chlamydia and gonorrhea.

## 2015-01-13 LAB — GC/CHLAMYDIA PROBE AMP
CT Probe RNA: NEGATIVE
GC PROBE AMP APTIMA: NEGATIVE

## 2015-03-12 ENCOUNTER — Ambulatory Visit (INDEPENDENT_AMBULATORY_CARE_PROVIDER_SITE_OTHER): Payer: BLUE CROSS/BLUE SHIELD | Admitting: Physician Assistant

## 2015-03-12 VITALS — BP 112/78 | HR 101 | Temp 98.4°F | Resp 16 | Ht 58.75 in | Wt 121.0 lb

## 2015-03-12 DIAGNOSIS — N898 Other specified noninflammatory disorders of vagina: Secondary | ICD-10-CM

## 2015-03-12 DIAGNOSIS — R112 Nausea with vomiting, unspecified: Secondary | ICD-10-CM

## 2015-03-12 DIAGNOSIS — B379 Candidiasis, unspecified: Secondary | ICD-10-CM | POA: Diagnosis not present

## 2015-03-12 DIAGNOSIS — R1032 Left lower quadrant pain: Secondary | ICD-10-CM

## 2015-03-12 LAB — POCT URINALYSIS DIPSTICK
Glucose, UA: NEGATIVE
Nitrite, UA: NEGATIVE
PROTEIN UA: NEGATIVE
Spec Grav, UA: 1.025
UROBILINOGEN UA: 1
pH, UA: 6

## 2015-03-12 LAB — POCT CBC
GRANULOCYTE PERCENT: 51 % (ref 37–80)
HCT, POC: 41.1 % (ref 37.7–47.9)
Hemoglobin: 12.9 g/dL (ref 12.2–16.2)
Lymph, poc: 3.2 (ref 0.6–3.4)
MCH: 27.3 pg (ref 27–31.2)
MCHC: 31.3 g/dL — AB (ref 31.8–35.4)
MCV: 87.3 fL (ref 80–97)
MID (CBC): 0.4 (ref 0–0.9)
MPV: 8.9 fL (ref 0–99.8)
POC GRANULOCYTE: 3.7 (ref 2–6.9)
POC LYMPH PERCENT: 43.4 %L (ref 10–50)
POC MID %: 5.6 %M (ref 0–12)
Platelet Count, POC: 191 10*3/uL (ref 142–424)
RBC: 4.71 M/uL (ref 4.04–5.48)
RDW, POC: 15.5 %
WBC: 7.3 10*3/uL (ref 4.6–10.2)

## 2015-03-12 LAB — POCT WET PREP WITH KOH
KOH Prep POC: NEGATIVE
Trichomonas, UA: NEGATIVE
YEAST WET PREP PER HPF POC: NEGATIVE

## 2015-03-12 LAB — POCT UA - MICROSCOPIC ONLY
CASTS, UR, LPF, POC: NEGATIVE
CRYSTALS, UR, HPF, POC: NEGATIVE
Mucus, UA: NEGATIVE
Yeast, UA: NEGATIVE

## 2015-03-12 LAB — POCT URINE PREGNANCY: Preg Test, Ur: NEGATIVE

## 2015-03-12 MED ORDER — DOXYCYCLINE HYCLATE 100 MG PO CAPS: 100.0000 mg | ORAL_CAPSULE | Freq: Two times a day (BID) | ORAL | Status: DC

## 2015-03-12 MED ORDER — CEFTRIAXONE SODIUM 1 G IJ SOLR
1.0000 g | Freq: Once | INTRAMUSCULAR | Status: AC
  Administered 2015-03-12: 1 g via INTRAMUSCULAR

## 2015-03-12 MED ORDER — METRONIDAZOLE 500 MG PO TABS: 500.0000 mg | ORAL_TABLET | Freq: Two times a day (BID) | ORAL | Status: DC

## 2015-03-12 MED ORDER — FLUCONAZOLE 150 MG PO TABS: 150.0000 mg | ORAL_TABLET | Freq: Once | ORAL | Status: DC

## 2015-03-12 NOTE — Progress Notes (Signed)
[facility] [address], [place] [address] [phone]  Date:  03/12/2015   Name:  Monica Armstrong   DOB:  March 15, 1991   MRN:  [id number]  PCP:  No Pcp Per Pt    History of Present Illness:  Shilpa Bushee is a 24 y.o. female patient who presents to [facility] for chief complaint of lower sternum pain, lower back, dizziness, and nausea.  Patient states that for about 1 week, she has had nausea and vomiting.  There is some abdominal pain at her left side and pelvic as well.  She may have 2-3 episodes per day, of non-bloody, non-bilious emesis.  In the last 4 days, she has had some dizziness aggravated by when she moves too fast.  She has discharge of a white milky and larger amount that is non-odorous, without itch or rash.  She has no urinary frequency, dysuria, or hematuria.  No constipation or nausea.  However, patient states that she may have a BM twice per month.  This has been chronic for her, and she denies bloating or abdominal pain.  She has a normal appetite.  Patient is sexually active and attempting pregnancy with partner.  Her mother and brothers live with her.  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  She is not taking any abx at this time.   Patient Active Problem List   Diagnosis Date Noted  . Health care maintenance 06/29/2014    No past medical history on file.  No past surgical history on file.  History  Substance Use Topics  . Smoking status: Never Smoker   . Smokeless tobacco: Not on file  . Alcohol Use: No    Family History  Problem Relation Age of Onset  . Heart disease Paternal Grandmother     No Known Allergies  Medication list has been reviewed and updated.  Current Outpatient Prescriptions on File Prior to Visit  Medication Sig Dispense Refill  . fluconazole (DIFLUCAN) 150 MG tablet Take 1 tablet (150 mg total) by mouth once. (Patient not taking: Reported on 03/12/2015) 1 tablet 0  . metroNIDAZOLE (FLAGYL) 500 MG tablet  Take 1 tablet (500 mg total) by mouth 2 (two) times daily with a meal. DO NOT CONSUME ALCOHOL WHILE TAKING THIS MEDICATION. (Patient not taking: Reported on 03/12/2015) 14 tablet 0  . Norgestimate-Ethinyl Estradiol Triphasic (ORTHO TRI-CYCLEN, 28,) 0.18/0.215/0.25 MG-35 MCG tablet Take 1 tablet by mouth daily. (Patient not taking: Reported on 01/12/2015) 1 Package 11   No current facility-administered medications on file prior to visit.    ROS ROS unremarkable unless listed above.    Physical Examination: BP 112/78 mmHg  Pulse 101  Temp(Src) 98.4 F (36.9 C) (Oral)  Resp 16  Ht 4' 10.75" (1.492 m)  Wt 121 lb (54.885 kg)  BMI 24.66 kg/m2  LMP 02/24/2015 Ideal Body Weight: Weight in (lb) to have BMI = 25: 122.5  Physical Exam  Constitutional: She is oriented to person, place, and time. She appears well-developed and well-nourished. No distress.  HENT:  Head: Normocephalic and atraumatic.  Eyes: Conjunctivae and EOM are normal. Pupils are equal, round, and reactive to light. Right eye exhibits no discharge. Left eye exhibits no discharge.  Neck: Normal range of motion. No thyromegaly present.  Cardiovascular: Normal rate and regular rhythm.  Exam reveals no friction rub.   No murmur heard. Pulmonary/Chest: Effort normal and breath sounds normal. No respiratory distress. She has no wheezes.  Abdominal: Soft. Normal  appearance and bowel sounds are normal. There is no hepatosplenomegaly. There is tenderness in the suprapubic area and left lower quadrant. There is negative Murphy's sign.  Neurological: She is alert and oriented to person, place, and time.  Skin: She is not diaphoretic.  Psychiatric: She has a normal mood and affect. Her behavior is normal.     Assessment and Plan: 24 year old female is here today for chief complaint of nausea, vomiting, dizziness, and abdominal pain.  Concern for PID, though there is no existing results of PID.  I have reviewed her notes and found no hx of  PID.  We will start her on the flagyl, doxycycline.   Urine culture sent, gc/chlamydia.  Will follow up in 24 hours, and may have further imaging. Given rocephin in office.  We will await pending labs.  Diff dx: PID, UTI, fibroids, cyst, constipation.    Nausea and vomiting, vomiting of unspecified type - Plan: POCT urine pregnancy, POCT CBC, POCT Wet Prep with KOH, POCT urinalysis dipstick, POCT UA - Microscopic Only, GC/Chlamydia Probe Amp, Urine culture, DISCONTINUED: metroNIDAZOLE (FLAGYL) 500 MG tablet  Vaginal discharge - Plan: POCT Wet Prep with KOH, GC/Chlamydia Probe Amp, Urine culture, cefTRIAXone (ROCEPHIN) injection 1 g, doxycycline (VIBRAMYCIN) 100 MG capsule, DISCONTINUED: metroNIDAZOLE (FLAGYL) 500 MG tablet  Left lower quadrant pain - Plan: POCT Wet Prep with KOH, POCT urinalysis dipstick, POCT UA - Microscopic Only, GC/Chlamydia Probe Amp, Urine culture, cefTRIAXone (ROCEPHIN) injection 1 g, doxycycline (VIBRAMYCIN) 100 MG capsule, DISCONTINUED: metroNIDAZOLE (FLAGYL) 500 MG tablet  Yeast infection - Plan: fluconazole (DIFLUCAN) 150 MG tablet    Trena Platt, PA-C [facility] 03/12/2015 8:49 AM

## 2015-03-12 NOTE — Patient Instructions (Signed)
Please return to the clinic tomorrow to see Dr. Patsy Lager. I will have your lab results as soon as possible.   Candida Infection A Candida infection (also called yeast, fungus, and Monilia infection) is an overgrowth of yeast that can occur anywhere on the body. A yeast infection commonly occurs in warm, moist body areas. Usually, the infection remains localized but can spread to become a systemic infection. A yeast infection may be a sign of a more severe disease such as diabetes, leukemia, or AIDS. A yeast infection can occur in both men and women. In women, Candida vaginitis is a vaginal infection. It is one of the most common causes of vaginitis. Men usually do not have symptoms or know they have an infection until other problems develop. Men may find out they have a yeast infection because their sex partner has a yeast infection. Uncircumcised men are more likely to get a yeast infection than circumcised men. This is because the uncircumcised glans is not exposed to air and does not remain as dry as that of a circumcised glans. Older adults may develop yeast infections around dentures. CAUSES  Women  Antibiotics.  Steroid medication taken for a long time.  Being overweight (obese).  Diabetes.  Poor immune condition.  Certain serious medical conditions.  Immune suppressive medications for organ transplant patients.  Chemotherapy.  Pregnancy.  Menstruation.  Stress and fatigue.  Intravenous drug use.  Oral contraceptives.  Wearing tight-fitting clothes in the crotch area.  Catching it from a sex partner who has a yeast infection.  Spermicide.  Intravenous, urinary, or other catheters. Men  Catching it from a sex partner who has a yeast infection.  Having oral or anal sex with a person who has the infection.  Spermicide.  Diabetes.  Antibiotics.  Poor immune system.  Medications that suppress the immune system.  Intravenous drug use.  Intravenous, urinary,  or other catheters. SYMPTOMS  Women  Thick, white vaginal discharge.  Vaginal itching.  Redness and swelling in and around the vagina.  Irritation of the lips of the vagina and perineum.  Blisters on the vaginal lips and perineum.  Painful sexual intercourse.  Low blood sugar (hypoglycemia).  Painful urination.  Bladder infections.  Intestinal problems such as constipation, indigestion, bad breath, bloating, increase in gas, diarrhea, or loose stools. Men  Men may develop intestinal problems such as constipation, indigestion, bad breath, bloating, increase in gas, diarrhea, or loose stools.  Dry, cracked skin on the penis with itching or discomfort.  Jock itch.  Dry, flaky skin.  Athlete's foot.  Hypoglycemia. DIAGNOSIS  Women  A history and an exam are performed.  The discharge may be examined under a microscope.  A culture may be taken of the discharge. Men  A history and an exam are performed.  Any discharge from the penis or areas of cracked skin will be looked at under the microscope and cultured.  Stool samples may be cultured. TREATMENT  Women  Vaginal antifungal suppositories and creams.  Medicated creams to decrease irritation and itching on the outside of the vagina.  Warm compresses to the perineal area to decrease swelling and discomfort.  Oral antifungal medications.  Medicated vaginal suppositories or cream for repeated or recurrent infections.  Wash and dry the irritation areas before applying the cream.  Eating yogurt with Lactobacillus may help with prevention and treatment.  Sometimes painting the vagina with gentian violet solution may help if creams and suppositories do not work. Men  Antifungal creams and  oral antifungal medications.  Sometimes treatment must continue for 30 days after the symptoms go away to prevent recurrence. HOME CARE INSTRUCTIONS  Women  Use cotton underwear and avoid tight-fitting  clothing.  Avoid colored, scented toilet paper and deodorant tampons or pads.  Do not douche.  Keep your diabetes under control.  Finish all the prescribed medications.  Keep your skin clean and dry.  Consume milk or yogurt with Lactobacillus-active culture regularly. If you get frequent yeast infections and think that is what the infection is, there are over-the-counter medications that you can get. If the infection does not show healing in 3 days, talk to your caregiver.  Tell your sex partner you have a yeast infection. Your partner may need treatment also, especially if your infection does not clear up or recurs. Men  Keep your skin clean and dry.  Keep your diabetes under control.  Finish all prescribed medications.  Tell your sex partner that you have a yeast infection so he or she can be treated if necessary. SEEK MEDICAL CARE IF:   Your symptoms do not clear up or worsen in one week after treatment.  You have an oral temperature above 102 F (38.9 C).  You have trouble swallowing or eating for a prolonged time.  You develop blisters on and around your vagina.  You develop vaginal bleeding and it is not your menstrual period.  You develop abdominal pain.  You develop intestinal problems as mentioned above.  You get weak or light-headed.  You have painful or increased urination.  You have pain during sexual intercourse. MAKE SURE YOU:   Understand these instructions.  Will watch your condition.  Will get help right away if you are not doing well or get worse. Document Released: 09/04/2004 Document Revised: 12/12/2013 Document Reviewed: 12/17/2009 [facility] Patient Information 2015 [place], [place]. This information is not intended to replace advice given to you by your health care provider. Make sure you discuss any questions you have with your health care provider.

## 2015-03-13 ENCOUNTER — Ambulatory Visit (INDEPENDENT_AMBULATORY_CARE_PROVIDER_SITE_OTHER): Payer: BLUE CROSS/BLUE SHIELD | Admitting: Family Medicine

## 2015-03-13 ENCOUNTER — Ambulatory Visit
Admission: RE | Admit: 2015-03-13 | Discharge: 2015-03-13 | Disposition: A | Discharge status: Home / Self Care | Payer: Medicaid Other | Source: Ambulatory Visit | Attending: Family Medicine | Admitting: Family Medicine

## 2015-03-13 VITALS — BP 106/68 | HR 74 | Temp 98.4°F | Resp 18

## 2015-03-13 DIAGNOSIS — R1084 Generalized abdominal pain: Secondary | ICD-10-CM | POA: Diagnosis not present

## 2015-03-13 DIAGNOSIS — Z8742 Personal history of other diseases of the female genital tract: Secondary | ICD-10-CM

## 2015-03-13 DIAGNOSIS — R8271 Bacteriuria: Secondary | ICD-10-CM

## 2015-03-13 DIAGNOSIS — N390 Urinary tract infection, site not specified: Secondary | ICD-10-CM | POA: Diagnosis not present

## 2015-03-13 LAB — POCT CBC
Granulocyte percent: 55.6 %G (ref 37–80)
HCT, POC: 43.3 % (ref 37.7–47.9)
Hemoglobin: 14.1 g/dL (ref 12.2–16.2)
Lymph, poc: 3 (ref 0.6–3.4)
MCH: 27.7 pg (ref 27–31.2)
MCHC: 32.6 g/dL (ref 31.8–35.4)
MCV: 85 fL (ref 80–97)
MID (CBC): 0.3 (ref 0–0.9)
MPV: 8.1 fL (ref 0–99.8)
POC Granulocyte: 4.1 (ref 2–6.9)
POC LYMPH PERCENT: 40.7 %L (ref 10–50)
POC MID %: 3.7 %M (ref 0–12)
Platelet Count, POC: 316 10*3/uL (ref 142–424)
RBC: 5.09 M/uL (ref 4.04–5.48)
RDW, POC: 14.6 %
WBC: 7.4 10*3/uL (ref 4.6–10.2)

## 2015-03-13 LAB — POCT UA - MICROSCOPIC ONLY
Casts, Ur, LPF, POC: NEGATIVE
Crystals, Ur, HPF, POC: NEGATIVE
Yeast, UA: NEGATIVE

## 2015-03-13 LAB — POCT URINALYSIS DIPSTICK
Blood, UA: NEGATIVE
GLUCOSE UA: NEGATIVE
Nitrite, UA: NEGATIVE
PH UA: 5.5
PROTEIN UA: 30
Spec Grav, UA: >=1.03
UROBILINOGEN UA: 1

## 2015-03-13 LAB — URINE CULTURE
COLONY COUNT: NO GROWTH
Organism ID, Bacteria: NO GROWTH

## 2015-03-13 LAB — GC/CHLAMYDIA PROBE AMP
CT Probe RNA: NEGATIVE
GC Probe RNA: NEGATIVE

## 2015-03-13 MED ORDER — GI COCKTAIL ~~LOC~~
30.0000 mL | Freq: Once | ORAL | Status: AC
  Administered 2015-03-13: 30 mL via ORAL

## 2015-03-13 MED ORDER — CEPHALEXIN 500 MG PO CAPS: 500.0000 mg | ORAL_CAPSULE | Freq: Two times a day (BID) | ORAL | Status: DC

## 2015-03-13 NOTE — Patient Instructions (Signed)
At this point your labs are most suggestive of an urinary tract infection You can stop the flagyl (metronidazole) but continue the doxycycline We are going to add keflex for UTI while we await your urine culture I will give you a call when I get your ultrasound results today  You will have your ultrasound at [facility], [address] Arrive at 1:30 pm An hour prior to your scan drink 32 oz of water and do not urinate- we need a full bladder

## 2015-03-13 NOTE — Progress Notes (Signed)
[facility] [address] [place] [address] [phone]  Date:  03/13/2015   Name:  Monica Armstrong   DOB:  [birth year]   MRN:  [id number]  PCP:  No Pcp Per Pt    Chief Complaint: Follow-up   History of Present Illness:  Monica Armstrong is a 24 y.o. very pleasant female patient who presents with the following:  She is here today to recheck from yesterday She was noted to have belly pain, undetermined cause but suspected PID.  There was a question of chlamydia history- however cannot find where she ever tested positive in the past. Her gonorrhea and chlamydia screening from yesterday was also negative  Treated with rocephin, po doxy and flagyl Here today because her pain persists  Yesterday her urine was dirty but non-specific She has genealized abd discomfort, but no anorexia No fever, no vomiting She is generally in good health History of ovarian cysts   Patient Active Problem List   Diagnosis Date Noted  . Health care maintenance 06/29/2014    History reviewed. No pertinent past medical history.  History reviewed. No pertinent past surgical history.  History  Substance Use Topics  . Smoking status: Never Smoker   . Smokeless tobacco: Not on file  . Alcohol Use: No    Family History  Problem Relation Age of Onset  . Heart disease Paternal Grandmother     No Known Allergies  Medication list has been reviewed and updated.  Current Outpatient Prescriptions on File Prior to Visit  Medication Sig Dispense Refill  . doxycycline (VIBRAMYCIN) 100 MG capsule Take 1 capsule (100 mg total) by mouth 2 (two) times daily. 20 capsule 0  . fluconazole (DIFLUCAN) 150 MG tablet Take 1 tablet (150 mg total) by mouth once. 1 tablet 0  . metroNIDAZOLE (FLAGYL) 500 MG tablet Take 1 tablet (500 mg total) by mouth 2 (two) times daily with a meal. DO NOT CONSUME ALCOHOL WHILE TAKING THIS MEDICATION. 28 tablet 0  . metroNIDAZOLE (FLAGYL) 500 MG tablet Take 1  tablet (500 mg total) by mouth 2 (two) times daily with a meal. DO NOT CONSUME ALCOHOL WHILE TAKING THIS MEDICATION. (Patient not taking: Reported on 03/12/2015) 14 tablet 0  . Norgestimate-Ethinyl Estradiol Triphasic (ORTHO TRI-CYCLEN, 28,) 0.18/0.215/0.25 MG-35 MCG tablet Take 1 tablet by mouth daily. (Patient not taking: Reported on 01/12/2015) 1 Package 11   No current facility-administered medications on file prior to visit.    Review of Systems:  As per HPI- otherwise negative.   Physical Examination: Filed Vitals:   03/13/15 1052  BP: 106/68  Pulse: 74  Temp: 98.4 F (36.9 C)  Resp: 18   There were no vitals filed for this visit. There is no weight on file to calculate BMI. Ideal Body Weight:    GEN: WDWN, NAD, Non-toxic, A & O x 3 HEENT: Atraumatic, Normocephalic. Neck supple. No masses, No LAD. Ears and Nose: No external deformity. CV: RRR, No M/G/R. No JVD. No thrill. No extra heart sounds. PULM: CTA B, no wheezes, crackles, rhonchi. No retractions. No resp. distress. No accessory muscle use. ABD: S, ND, +BS. No rebound. No HSM.  She has diffuse tenderness throughout her abdomen- does not localize to any particular pace EXTR: No c/c/e NEURO Normal gait.  PSYCH: Normally interactive. Conversant. Not depressed or anxious appearing.  Calm demeanor.   Given a GI cocktail: this did not change her sx Results for orders placed or performed in visit on 03/13/15  POCT CBC  Result Value Ref Range   WBC 7.4 4.6 - 10.2 K/uL   Lymph, poc 3.0 0.6 - 3.4   POC LYMPH PERCENT 40.7 10 - 50 %L   MID (cbc) 0.3 0 - 0.9   POC MID % 3.7 0 - 12 %M   POC Granulocyte 4.1 2 - 6.9   Granulocyte percent 55.6 37 - 80 %G   RBC 5.09 4.04 - 5.48 M/uL   Hemoglobin 14.1 12.2 - 16.2 g/dL   HCT, POC 16.1 09.6 - 47.9 %   MCV 85.0 80 - 97 fL   MCH, POC 27.7 27 - 31.2 pg   MCHC 32.6 31.8 - 35.4 g/dL   RDW, POC 04.5 %   Platelet Count, POC 316 142 - 424 K/uL   MPV 8.1 0 - 99.8 fL  POCT UA -  Microscopic Only  Result Value Ref Range   WBC, Ur, HPF, POC tntc    RBC, urine, microscopic 2-5    Bacteria, U Microscopic trace    Mucus, UA large    Epithelial cells, urine per micros 20-25    Crystals, Ur, HPF, POC neg    Casts, Ur, LPF, POC neg    Yeast, UA neg   POCT urinalysis dipstick  Result Value Ref Range   Color, UA yellow    Clarity, UA cloudy    Glucose, UA neg    Bilirubin, UA small    Ketones, UA trace    Spec Grav, UA >=1.030    Blood, UA neg    pH, UA 5.5    Protein, UA 30    Urobilinogen, UA 1.0    Nitrite, UA neg    Leukocytes, UA moderate (2+) (A) Negative    Assessment and Plan: Generalized abdominal pain - Plan: POCT CBC, POCT UA - Microscopic Only, POCT urinalysis dipstick, gi cocktail (Maalox,Lidocaine,Donnatal)  History of ovarian cyst - Plan: US Pelvis Complete, US Transvaginal Non-OB  At this time her picture is more suggestive of a UTI than PID.  Yesterdays's urine culture is still pending.   Will continue her doxycycline for PID, but will stop flagyl and start keflex for UTI Discussed imaging, benefits and drawbacks of [place] vs CT.  Most strongly suspect ovarian cyst as she has had these in the past and her wbc count is normal.  She would like to proceed with [place] today  Signed Abbe Amsterdam, MD  Received [place] results, called her to discuss.  Looks normal, will check on her in the am.  If still having sx plan for a CT scan.  She agres  TRANSABDOMINAL AND TRANSVAGINAL ULTRASOUND OF PELVIS  TECHNIQUE: Both transabdominal and transvaginal ultrasound examinations of the pelvis were performed. Transabdominal technique was performed for global imaging of the pelvis including uterus, ovaries, adnexal regions, and pelvic cul-de-sac. It was necessary to proceed with endovaginal exam following the transabdominal exam to visualize the endometrium and adnexal structures.  COMPARISON: None in PACs  FINDINGS: Uterus Measurements: 8.5 x 3.0 x 5 cm. No  fibroids or other mass visualized.  Endometrium Thickness: 11.2 mm. No focal abnormality visualized.  Right ovary Measurements: 3.1 x 2.2 x 2.9 cm. Normal appearance/no adnexal mass.  Left ovary Measurements: 3.3 x 2.0 x 2.5 cm. Normal appearance/no adnexal mass.  Other findings  There is no free pelvic fluid.  IMPRESSION: Normal pelvic ultrasound. No ovarian cystic structures are observed.

## 2015-03-14 ENCOUNTER — Telehealth: Payer: Self-pay | Admitting: Family Medicine

## 2015-03-14 ENCOUNTER — Encounter: Payer: Self-pay | Admitting: Physician Assistant

## 2015-03-14 NOTE — Telephone Encounter (Signed)
Called to check on her today. She reports no pain at all so far today- she will keep me posted

## 2015-03-15 ENCOUNTER — Telehealth: Payer: Self-pay

## 2015-03-15 NOTE — Telephone Encounter (Signed)
Called her back- she states that she is having 8/10 abd pain, but is at work and does not want to leave.  Counseled her that if she is indeed having severe abd pain she needs to go to there ER for evaluation.  We have considered doing a CT scan and it may be time to do so.  She agrees to go to the ER of her choice for further evaluation

## 2015-03-15 NOTE — Telephone Encounter (Signed)
Pearline Cables, MD at 03/15/2015 2:09 PM     Status: Signed       Expand All Collapse All   Called to check on her- asked her to contact us if not continuing to feel well            Pearline Cables, MD at 03/14/2015 12:08 PM     Status: Signed       Expand All Collapse All   Called to check on her today. She reports no pain at all so far today- she will keep me posted

## 2015-03-15 NOTE — Telephone Encounter (Signed)
Called to check on her- asked her to contact us if not continuing to feel well

## 2015-03-15 NOTE — Telephone Encounter (Signed)
Pt was returning Dr.Coplands call she said she woke up with pain in her back and it moved to her right side  Please advise [phone]

## 2015-03-20 ENCOUNTER — Emergency Department (HOSPITAL_COMMUNITY)
Admission: EM | Admit: 2015-03-20 | Discharge: 2015-03-20 | Disposition: A | Discharge status: Home / Self Care | Payer: BLUE CROSS/BLUE SHIELD | Attending: Emergency Medicine | Admitting: Emergency Medicine

## 2015-03-20 DIAGNOSIS — N390 Urinary tract infection, site not specified: Secondary | ICD-10-CM | POA: Insufficient documentation

## 2015-03-20 DIAGNOSIS — N760 Acute vaginitis: Secondary | ICD-10-CM | POA: Diagnosis not present

## 2015-03-20 DIAGNOSIS — Z3202 Encounter for pregnancy test, result negative: Secondary | ICD-10-CM | POA: Diagnosis not present

## 2015-03-20 DIAGNOSIS — R1030 Lower abdominal pain, unspecified: Secondary | ICD-10-CM | POA: Diagnosis present

## 2015-03-20 DIAGNOSIS — B9689 Other specified bacterial agents as the cause of diseases classified elsewhere: Secondary | ICD-10-CM

## 2015-03-20 LAB — URINALYSIS, ROUTINE W REFLEX MICROSCOPIC
Bilirubin Urine: NEGATIVE
Glucose, UA: NEGATIVE mg/dL
Ketones, ur: NEGATIVE mg/dL
Nitrite: NEGATIVE
Protein, ur: NEGATIVE mg/dL
Specific Gravity, Urine: 1.029 (ref 1.005–1.030)
Urobilinogen, UA: 1 mg/dL (ref 0.0–1.0)
pH: 5.5 (ref 5.0–8.0)

## 2015-03-20 LAB — COMPREHENSIVE METABOLIC PANEL
ALBUMIN: 4.1 g/dL (ref 3.5–5.0)
ALK PHOS: 61 U/L (ref 38–126)
ALT: 15 U/L (ref 14–54)
ANION GAP: 7 (ref 5–15)
AST: 19 U/L (ref 15–41)
BUN: 12 mg/dL (ref 6–20)
CALCIUM: 9.4 mg/dL (ref 8.9–10.3)
CO2: 25 mmol/L (ref 22–32)
Chloride: 105 mmol/L (ref 101–111)
Creatinine, Ser: 0.72 mg/dL (ref 0.44–1.00)
GFR calc Af Amer: >60 mL/min (ref >60)
Glucose, Bld: 86 mg/dL (ref 65–99)
POTASSIUM: 4.4 mmol/L (ref 3.5–5.1)
Sodium: 137 mmol/L (ref 135–145)
TOTAL PROTEIN: 8 g/dL (ref 6.5–8.1)
Total Bilirubin: 0.5 mg/dL (ref 0.3–1.2)

## 2015-03-20 LAB — CBC
HCT: 39 % (ref 36.0–46.0)
Hemoglobin: 13.2 g/dL (ref 12.0–15.0)
MCH: 28.6 pg (ref 26.0–34.0)
MCHC: 33.8 g/dL (ref 30.0–36.0)
MCV: 84.6 fL (ref 78.0–100.0)
Platelets: 254 10*3/uL (ref 150–400)
RBC: 4.61 MIL/uL (ref 3.87–5.11)
RDW: 13.5 % (ref 11.5–15.5)
WBC: 6.1 10*3/uL (ref 4.0–10.5)

## 2015-03-20 LAB — POC URINE PREG, ED: Preg Test, Ur: NEGATIVE

## 2015-03-20 LAB — WET PREP, GENITAL
Trich, Wet Prep: NONE SEEN
Yeast Wet Prep HPF POC: NONE SEEN

## 2015-03-20 LAB — URINE MICROSCOPIC-ADD ON

## 2015-03-20 LAB — LIPASE, BLOOD: Lipase: 30 U/L (ref 22–51)

## 2015-03-20 MED ORDER — IBUPROFEN 800 MG PO TABS: 800.0000 mg | ORAL_TABLET | Freq: Three times a day (TID) | ORAL | Status: DC | PRN

## 2015-03-20 MED ORDER — CEFTRIAXONE SODIUM 1 G IJ SOLR
1.0000 g | Freq: Once | INTRAMUSCULAR | Status: AC
  Administered 2015-03-20: 1 g via INTRAMUSCULAR
  Filled 2015-03-20: qty 10

## 2015-03-20 MED ORDER — METRONIDAZOLE 500 MG PO TABS: 500.0000 mg | ORAL_TABLET | Freq: Two times a day (BID) | ORAL | Status: DC

## 2015-03-20 MED ORDER — CEPHALEXIN 500 MG PO CAPS: 500.0000 mg | ORAL_CAPSULE | Freq: Four times a day (QID) | ORAL | Status: DC

## 2015-03-20 NOTE — Care Management Note (Signed)
Case Management Note  Patient Details  Name: Monica Armstrong MRN: [id number] Date of Birth: Nov 25, 1990  Subjective/Objective:           24 yr old female covered by [employer] c/o abdominal pain states her pcp is Dr Dallas Schimke         Action/Plan:  Updated EPIC with pcp Expected Discharge Date:      March 20 2015             Expected Discharge Plan:    home   In-House Referral:   na  Discharge planning Services      Status of Service:   completed  Additional Comments:  [name], RN 03/20/2015, 2:04 PM

## 2015-03-20 NOTE — ED Provider Notes (Signed)
CSN: [id number]     Arrival date & time 03/20/15  1119 History   First MD Initiated Contact with Patient 03/20/15 1127     Chief Complaint  Patient presents with  . Abdominal Pain     (Consider location/radiation/quality/duration/timing/severity/associated sxs/prior Treatment) The history is provided by the patient and medical records.     Pt p/w abdominal pain, urinary frequency, abnormal vaginal discharge, occasional N/V, occasional lightheadedness/dizziness, occasional chest pain and breast pain.  The pain began approximately two and a half weeks ago.  The pain has been a daily occurrence.  States that the pain moves around from side to side but typically starts on one of her sides, radiates around from her low back to her lower abdomen; then she normally develops central chest pain and one of her breasts may hurt.  She has been trying to get pregnant, has one female partner, LMP July 16 was on time but lighter than usual.  Has been followed by Dr Dallas Schimke at [facility] - pt states she was told by Dr Dallas Schimke to come to the ED for a CT scan.  On review of the chart, Dr Dallas Schimke and pt had discussed CT scan and they were planning for outpatient CT scan, then pt was encouraged to come to ED for further evaluation.  Pt was initially treated for PID and then was treated for UTI per chart, patient notes she never took the antibiotics prescribed to her for UTI.   No past medical history on file. No past surgical history on file. Family History  Problem Relation Age of Onset  . Heart disease Paternal Grandmother    History  Substance Use Topics  . Smoking status: Never Smoker   . Smokeless tobacco: Not on file  . Alcohol Use: No   OB History    No data available     Review of Systems  All other systems reviewed and are negative.     Allergies  Review of patient's allergies indicates no known allergies.  Home Medications   Prior to Admission medications    Medication Sig Start Date End Date Taking? Authorizing Provider  cephALEXin (KEFLEX) 500 MG capsule Take 1 capsule (500 mg total) by mouth 2 (two) times daily. Patient not taking: Reported on 03/20/2015 03/13/15   Pearline Cables, MD  doxycycline (VIBRAMYCIN) 100 MG capsule Take 1 capsule (100 mg total) by mouth 2 (two) times daily. Patient not taking: Reported on 03/20/2015 03/12/15 03/22/15  Collie Siad English, PA  fluconazole (DIFLUCAN) 150 MG tablet Take 1 tablet (150 mg total) by mouth once. Patient not taking: Reported on 03/20/2015 03/12/15   Collie Siad English, PA   BP 113/76 mmHg  Pulse 88  Temp(Src) 98.2 F (36.8 C) (Oral)  Resp 18  SpO2 100%  LMP 02/24/2015 Physical Exam  Constitutional: She appears well-developed and well-nourished. No distress.  HENT:  Head: Normocephalic and atraumatic.  Neck: Neck supple.  Cardiovascular: Normal rate and regular rhythm.   Pulmonary/Chest: Effort normal and breath sounds normal. No respiratory distress. She has no wheezes. She has no rales.  Abdominal: Soft. She exhibits no distension. There is tenderness in the right upper quadrant, epigastric area, suprapubic area, left upper quadrant and left lower quadrant. There is CVA tenderness (left). There is no rebound and no guarding.  Genitourinary: Cervix exhibits no motion tenderness. Right adnexum displays no mass, no tenderness and no fullness. Left adnexum displays no mass, no tenderness and no fullness. No erythema, tenderness  or bleeding in the vagina. No foreign body around the vagina. Vaginal discharge found.  Creamy white discharge in vagina.  Midline tenderness on bimanual exam.  No adnexal tenderness.    Neurological: She is alert.  Skin: She is not diaphoretic.  Nursing note and vitals reviewed.   ED Course  Procedures (including critical care time) Labs Review Labs Reviewed  WET PREP, GENITAL - Abnormal; Notable for the following:    Clue Cells Wet Prep HPF POC FEW (*)    WBC, Wet  Prep HPF POC MANY (*)    All other components within normal limits  URINALYSIS, ROUTINE W REFLEX MICROSCOPIC (NOT AT [facility]) - Abnormal; Notable for the following:    Color, Urine AMBER (*)    APPearance TURBID (*)    Hgb urine dipstick SMALL (*)    Leukocytes, UA LARGE (*)    All other components within normal limits  URINE MICROSCOPIC-ADD ON - Abnormal; Notable for the following:    Squamous Epithelial / LPF MANY (*)    Bacteria, UA MANY (*)    All other components within normal limits  LIPASE, BLOOD  COMPREHENSIVE METABOLIC PANEL  CBC  HIV ANTIBODY (ROUTINE TESTING)  RPR  POC URINE PREG, ED  GC/CHLAMYDIA PROBE AMP ([place]) NOT AT [facility]    Imaging Review No results found.   EKG Interpretation None      MDM   Final diagnoses:  UTI (lower urinary tract infection)  BV (bacterial vaginosis)    Afebrile, nontoxic patient with vague moving abdominal pain though most consistently on the left side.  Left CVA tenderness on exam.  Previously diagnosed with UTI and did not take antibiotics.  UA appears infected.  Suspect UTI vs early pyelonephritis causing symptoms.  Labs unremarkable.  Wet prep with few clue cells, pt does have abnormal discharge on exam.  No clinical e/o PID.  I do not think pt needs further imaging at this time.  D/C home with antibiotics (keflex, flagyl), PCP follow up.   Discussed result, findings, treatment, and follow up  with patient.  Pt given return precautions.  Pt verbalizes understanding and agrees with plan.          Trixie Dredge, PA-C 03/20/15 1639  Laurence Spates, MD 03/21/15 [phone]

## 2015-03-20 NOTE — ED Notes (Addendum)
Pt c/o left lateral abdominal pain radiating to left lower abdomen/groin, nausea, and emesis x 1 week. Pt states that pain is sometimes on right side of abdomen or in epigastric area. Denies diarrhea.

## 2015-03-20 NOTE — Discharge Instructions (Signed)
Read the information below.  Use the prescribed medication as directed.  Please discuss all new medications with your pharmacist.  You may return to the Emergency Department at any time for worsening condition or any new symptoms that concern you.    If you develop high fevers, worsening abdominal pain, uncontrolled vomiting, or are unable to tolerate fluids by mouth, return to the ER for a recheck.    Urinary Tract Infection A urinary tract infection (UTI) can occur any place along the urinary tract. The tract includes the kidneys, ureters, bladder, and urethra. A type of germ called bacteria often causes a UTI. UTIs are often helped with antibiotic medicine.  HOME CARE   If given, take antibiotics as told by your doctor. Finish them even if you start to feel better.  Drink enough fluids to keep your pee (urine) clear or pale yellow.  Avoid tea, drinks with caffeine, and bubbly (carbonated) drinks.  Pee often. Avoid holding your pee in for a long time.  Pee before and after having sex (intercourse).  Wipe from front to back after you poop (bowel movement) if you are a woman. Use each tissue only once. GET HELP RIGHT AWAY IF:   You have back pain.  You have lower belly (abdominal) pain.  You have chills.  You feel sick to your stomach (nauseous).  You throw up (vomit).  Your burning or discomfort with peeing does not go away.  You have a fever.  Your symptoms are not better in 3 days. MAKE SURE YOU:   Understand these instructions.  Will watch your condition.  Will get help right away if you are not doing well or get worse. Document Released: 01/14/2008 Document Revised: 04/21/2012 Document Reviewed: 02/26/2012 [facility] Patient Information 2015 [place], [place]. This information is not intended to replace advice given to you by your health care provider. Make sure you discuss any questions you have with your health care provider.  Bacterial Vaginosis Bacterial vaginosis  is an infection of the vagina. It happens when too many of certain germs (bacteria) grow in the vagina. HOME CARE  Take your medicine as told by your doctor.  Finish your medicine even if you start to feel better.  Do not have sex until you finish your medicine and are better.  Tell your sex partner that you have an infection. They should see their doctor for treatment.  Practice safe sex. Use condoms. Have only one sex partner. GET HELP IF:  You are not getting better after 3 days of treatment.  You have more grey fluid (discharge) coming from your vagina than before.  You have more pain than before.  You have a fever. MAKE SURE YOU:   Understand these instructions.  Will watch your condition.  Will get help right away if you are not doing well or get worse. Document Released: 05/06/2008 Document Revised: 05/18/2013 Document Reviewed: 03/09/2013 [facility] Patient Information 2015 [place], [place]. This information is not intended to replace advice given to you by your health care provider. Make sure you discuss any questions you have with your health care provider.

## 2015-03-20 NOTE — ED Notes (Signed)
Pt denies emesis in past 24 hours.

## 2015-03-21 LAB — RPR: RPR Ser Ql: NONREACTIVE

## 2015-03-21 LAB — GC/CHLAMYDIA PROBE AMP (~~LOC~~) NOT AT ARMC
CHLAMYDIA, DNA PROBE: NEGATIVE
Neisseria Gonorrhea: NEGATIVE

## 2015-03-21 LAB — HIV ANTIBODY (ROUTINE TESTING W REFLEX): HIV Screen 4th Generation wRfx: NONREACTIVE

## 2015-04-08 ENCOUNTER — Encounter (HOSPITAL_COMMUNITY): Payer: Self-pay | Admitting: Emergency Medicine

## 2015-04-08 ENCOUNTER — Emergency Department (HOSPITAL_COMMUNITY)
Admission: EM | Admit: 2015-04-08 | Discharge: 2015-04-09 | Disposition: A | Discharge status: Home / Self Care | Payer: BLUE CROSS/BLUE SHIELD | Attending: Emergency Medicine | Admitting: Emergency Medicine

## 2015-04-08 ENCOUNTER — Emergency Department (HOSPITAL_COMMUNITY): Payer: BLUE CROSS/BLUE SHIELD

## 2015-04-08 DIAGNOSIS — R079 Chest pain, unspecified: Secondary | ICD-10-CM | POA: Diagnosis present

## 2015-04-08 DIAGNOSIS — Z3202 Encounter for pregnancy test, result negative: Secondary | ICD-10-CM | POA: Insufficient documentation

## 2015-04-08 DIAGNOSIS — R0789 Other chest pain: Secondary | ICD-10-CM | POA: Insufficient documentation

## 2015-04-08 DIAGNOSIS — R0602 Shortness of breath: Secondary | ICD-10-CM | POA: Diagnosis not present

## 2015-04-08 LAB — BASIC METABOLIC PANEL
Anion gap: 6 (ref 5–15)
CHLORIDE: 103 mmol/L (ref 101–111)
CO2: 26 mmol/L (ref 22–32)
CREATININE: 0.68 mg/dL (ref 0.44–1.00)
Calcium: 9 mg/dL (ref 8.9–10.3)
GFR calc Af Amer: >60 mL/min (ref >60)
GFR calc non Af Amer: >60 mL/min (ref >60)
Glucose, Bld: 80 mg/dL (ref 65–99)
Potassium: 4.3 mmol/L (ref 3.5–5.1)
SODIUM: 135 mmol/L (ref 135–145)

## 2015-04-08 LAB — CBC
HCT: 40.1 % (ref 36.0–46.0)
Hemoglobin: 13.3 g/dL (ref 12.0–15.0)
MCH: 28.4 pg (ref 26.0–34.0)
MCHC: 33.2 g/dL (ref 30.0–36.0)
MCV: 85.7 fL (ref 78.0–100.0)
Platelets: 285 10*3/uL (ref 150–400)
RBC: 4.68 MIL/uL (ref 3.87–5.11)
RDW: 13.8 % (ref 11.5–15.5)
WBC: 7.3 10*3/uL (ref 4.0–10.5)

## 2015-04-08 LAB — I-STAT TROPONIN, ED: Troponin i, poc: 0.01 ng/mL (ref 0.00–0.08)

## 2015-04-08 LAB — POC URINE PREG, ED: Preg Test, Ur: NEGATIVE

## 2015-04-08 MED ORDER — IBUPROFEN 400 MG PO TABS
600.0000 mg | ORAL_TABLET | Freq: Once | ORAL | Status: AC
  Administered 2015-04-09: 600 mg via ORAL
  Filled 2015-04-08 (×2): qty 1

## 2015-04-08 NOTE — ED Notes (Signed)
Pt taken to bathroom in wheelchair

## 2015-04-08 NOTE — ED Notes (Signed)
Pt. reports central chest pain with SOB onset today , pain increases with deep inspiration , denies cough or congestion , no nausea or diaphoresis .

## 2015-04-08 NOTE — ED Provider Notes (Signed)
CSN: [id number]     Arrival date & time 04/08/15  2039 History   This chart was scribed for No att. providers found by Arlan Organ, ED Scribe. This patient was seen in room B15C/B15C and the patient's care was started 11:37 PM.   Chief Complaint  Patient presents with  . Chest Pain   The history is provided by the patient. No language interpreter was used.    HPI Comments: Monica Armstrong is a 24 y.o. female without any pertinent past medical history who presents to the Emergency Department complaining of constant, ongoing central chest pain with associated mild shortness of breath onset early this morning. Pain is worsened with deep breathing and movement. No alleviating factors at this time. No OTC medications or home remedies attempted prior to arrival. No recent fever, chills, cough, congestions, nausea, vomiting, back pain, or diaphoresis. Denies any leg swelling. No recent long distance travel. She denies any history of same. No known allergies to medications.  History reviewed. No pertinent past medical history. History reviewed. No pertinent past surgical history. Family History  Problem Relation Age of Onset  . Heart disease Paternal Grandmother    Social History  Substance Use Topics  . Smoking status: Never Smoker   . Smokeless tobacco: None  . Alcohol Use: No   OB History    No data available     Review of Systems  Constitutional: Negative for fever and chills.  HENT: Negative for congestion and sinus pressure.   Respiratory: Positive for shortness of breath. Negative for cough and wheezing.   Cardiovascular: Positive for chest pain.  Gastrointestinal: Negative for nausea, vomiting and abdominal pain.  Musculoskeletal: Negative for myalgias, back pain, neck pain and neck stiffness.  Skin: Negative for rash and wound.  Neurological: Negative for dizziness, syncope, weakness, numbness and headaches.  Psychiatric/Behavioral: Negative for confusion.  All other systems  reviewed and are negative.     Allergies  Review of patient's allergies indicates no known allergies.  Home Medications   Prior to Admission medications   Medication Sig Start Date End Date Taking? Authorizing Provider  cephALEXin (KEFLEX) 500 MG capsule Take 1 capsule (500 mg total) by mouth 4 (four) times daily. Patient not taking: Reported on 04/08/2015 03/20/15   Trixie Dredge, PA-C  ibuprofen (ADVIL,MOTRIN) 600 MG tablet Take 1 tablet (600 mg total) by mouth 3 (three) times daily after meals. 04/09/15   Loren Racer, MD  metroNIDAZOLE (FLAGYL) 500 MG tablet Take 1 tablet (500 mg total) by mouth 2 (two) times daily. One po bid x 7 days Patient not taking: Reported on 04/08/2015 03/20/15   Trixie Dredge, PA-C   Triage Vitals: BP 115/71 mmHg  Pulse 70  Temp(Src) 98.6 F (37 C) (Oral)  Resp 16  Ht 4\' 11"  (1.499 m)  Wt 120 lb (54.432 kg)  BMI 24.22 kg/m2  SpO2 100%  LMP 03/21/2015   Physical Exam  Constitutional: She is oriented to person, place, and time. She appears well-developed and well-nourished. No distress.  HENT:  Head: Normocephalic and atraumatic.  Mouth/Throat: Oropharynx is clear and moist.  Eyes: EOM are normal. Pupils are equal, round, and reactive to light.  Neck: Normal range of motion. Neck supple.  Cardiovascular: Normal rate and regular rhythm.   Pulmonary/Chest: Effort normal and breath sounds normal. No respiratory distress. She has no wheezes. She has no rales. She exhibits tenderness (patient with tenderness on palpation to the central upper chest. There is no crepitance or deformity. No obvious injuries.).  Abdominal: Soft. Bowel sounds are normal. She exhibits no distension and no mass. There is no tenderness. There is no rebound and no guarding.  Musculoskeletal: Normal range of motion. She exhibits no edema or tenderness.  No calf swelling or tenderness.  Neurological: She is alert and oriented to person, place, and time.  Moves all extremities without  deficit. Sensation is grossly intact.  Skin: Skin is warm and dry. No rash noted. No erythema.  Psychiatric: She has a normal mood and affect. Her behavior is normal.  Nursing note and vitals reviewed.   ED Course  Procedures (including critical care time)  DIAGNOSTIC STUDIES: Oxygen Saturation is 100% on RA, Normal by my interpretation.    COORDINATION OF CARE: 11:41 PM-Discussed treatment plan with pt at bedside and pt agreed to plan.  a   Labs Review Labs Reviewed  BASIC METABOLIC PANEL - Abnormal; Notable for the following:    BUN <5 (*)    All other components within normal limits  CBC  D-DIMER, QUANTITATIVE (NOT AT [facility])  Rosezena Sensor, ED  POC URINE PREG, ED    Imaging Review Dg Chest 2 View  04/09/2015   CLINICAL DATA:  Midchest pain and dyspnea, onset this morning  EXAM: CHEST  2 VIEW  COMPARISON:  10/05/2011  FINDINGS: The heart size and mediastinal contours are within normal limits. Both lungs are clear. The visualized skeletal structures are unremarkable.  IMPRESSION: No active cardiopulmonary disease.   Electronically Signed   By: Ellery Plunk M.D.   On: 04/09/2015 00:09   I have personally reviewed and evaluated these images and lab results as part of my medical decision-making.   EKG Interpretation   Date/Time:  Sunday April 08 2015 20:45:43 EDT Ventricular Rate:  72 PR Interval:  146 QRS Duration: 74 QT Interval:  358 QTC Calculation: 392 R Axis:   90 Text Interpretation:  Normal sinus rhythm with sinus arrhythmia Rightward  axis Borderline ECG Confirmed by Ranae Palms  MD, Dionysios Massman ([id number]) on 04/08/2015  11:28:26 PM      MDM   Final diagnoses:  Chest wall pain    I personally performed the services described in this documentation, which was scribed in my presence. The recorded information has been reviewed and is accurate.    Patient's chest pain is completely reproduced with palpation. She has a normal EKG, normal d-dimer, normal  troponin. Likely has costochondritis versus pleurisy. We'll treat with anti-inflammatory medication. Return precautions have been given.  Loren Racer, MD 04/09/15 [phone]

## 2015-04-09 LAB — D-DIMER, QUANTITATIVE: D-Dimer, Quant: <0.27 ug/mL-FEU (ref 0.00–0.48)

## 2015-04-09 MED ORDER — IBUPROFEN 600 MG PO TABS: 600.0000 mg | ORAL_TABLET | Freq: Three times a day (TID) | ORAL | Status: DC

## 2015-04-09 NOTE — ED Notes (Signed)
Pt verbalized understanding of d/c instructions and has no further questions. NAD 

## 2015-04-09 NOTE — Discharge Instructions (Signed)
Chest Wall Pain °Chest wall pain is pain in or around the bones and muscles of your chest. It may take up to 6 weeks to get better. It may take longer if you must stay physically active in your work and activities.  °CAUSES  °Chest wall pain may happen on its own. However, it may be caused by: °· A viral illness like the flu. °· Injury. °· Coughing. °· Exercise. °· Arthritis. °· Fibromyalgia. °· Shingles. °HOME CARE INSTRUCTIONS  °· Avoid overtiring physical activity. Try not to strain or perform activities that cause pain. This includes any activities using your chest or your abdominal and side muscles, especially if heavy weights are used. °· Put ice on the sore area. °¨ Put ice in a plastic bag. °¨ Place a towel between your skin and the bag. °¨ Leave the ice on for 15-20 minutes per hour while awake for the first 2 days. °· Only take over-the-counter or prescription medicines for pain, discomfort, or fever as directed by your caregiver. °SEEK IMMEDIATE MEDICAL CARE IF:  °· Your pain increases, or you are very uncomfortable. °· You have a fever. °· Your chest pain becomes worse. °· You have new, unexplained symptoms. °· You have nausea or vomiting. °· You feel sweaty or lightheaded. °· You have a cough with phlegm (sputum), or you cough up blood. °MAKE SURE YOU:  °· Understand these instructions. °· Will watch your condition. °· Will get help right away if you are not doing well or get worse. °Document Released: 07/28/2005 Document Revised: 10/20/2011 Document Reviewed: 03/24/2011 °[employer]® Patient Information ©2015 [employer]. This information is not intended to replace advice given to you by your health care provider. Make sure you discuss any questions you have with your health care provider. ° °Costochondritis °Costochondritis, sometimes called Tietze syndrome, is a swelling and irritation (inflammation) of the tissue (cartilage) that connects your ribs with your breastbone (sternum). It causes pain in  the chest and rib area. Costochondritis usually goes away on its own over time. It can take up to 6 weeks or longer to get better, especially if you are unable to limit your activities. °CAUSES  °Some cases of costochondritis have no known cause. Possible causes include: °· Injury (trauma). °· Exercise or activity such as lifting. °· Severe coughing. °SIGNS AND SYMPTOMS °· Pain and tenderness in the chest and rib area. °· Pain that gets worse when coughing or taking deep breaths. °· Pain that gets worse with specific movements. °DIAGNOSIS  °Your health care provider will do a physical exam and ask about your symptoms. Chest X-rays or other tests may be done to rule out other problems. °TREATMENT  °Costochondritis usually goes away on its own over time. Your health care provider may prescribe medicine to help relieve pain. °HOME CARE INSTRUCTIONS  °· Avoid exhausting physical activity. Try not to strain your ribs during normal activity. This would include any activities using chest, abdominal, and side muscles, especially if heavy weights are used. °· Apply ice to the affected area for the first 2 days after the pain begins. °¨ Put ice in a plastic bag. °¨ Place a towel between your skin and the bag. °¨ Leave the ice on for 20 minutes, 2-3 times a day. °· Only take over-the-counter or prescription medicines as directed by your health care provider. °SEEK MEDICAL CARE IF: °· You have redness or swelling at the rib joints. These are signs of infection. °· Your pain does not go away despite rest   or medicine. °SEEK IMMEDIATE MEDICAL CARE IF:  °· Your pain increases or you are very uncomfortable. °· You have shortness of breath or difficulty breathing. °· You cough up blood. °· You have worse chest pains, sweating, or vomiting. °· You have a fever or persistent symptoms for more than 2-3 days. °· You have a fever and your symptoms suddenly get worse. °MAKE SURE YOU:  °· Understand these instructions. °· Will watch your  condition. °· Will get help right away if you are not doing well or get worse. °Document Released: 05/07/2005 Document Revised: 05/18/2013 Document Reviewed: 03/01/2013 °[employer]® Patient Information ©2015 [employer]. This information is not intended to replace advice given to you by your health care provider. Make sure you discuss any questions you have with your health care provider. ° °

## 2015-05-14 ENCOUNTER — Ambulatory Visit (INDEPENDENT_AMBULATORY_CARE_PROVIDER_SITE_OTHER): Payer: BLUE CROSS/BLUE SHIELD | Admitting: Physician Assistant

## 2015-05-14 ENCOUNTER — Encounter: Payer: Self-pay | Admitting: Physician Assistant

## 2015-05-14 VITALS — BP 103/69 | HR 78 | Temp 99.1°F | Resp 16 | Ht 60.5 in | Wt 120.8 lb

## 2015-05-14 DIAGNOSIS — N3090 Cystitis, unspecified without hematuria: Secondary | ICD-10-CM | POA: Diagnosis not present

## 2015-05-14 DIAGNOSIS — E010 Iodine-deficiency related diffuse (endemic) goiter: Secondary | ICD-10-CM

## 2015-05-14 DIAGNOSIS — N644 Mastodynia: Secondary | ICD-10-CM | POA: Diagnosis not present

## 2015-05-14 DIAGNOSIS — R112 Nausea with vomiting, unspecified: Secondary | ICD-10-CM | POA: Diagnosis not present

## 2015-05-14 DIAGNOSIS — E049 Nontoxic goiter, unspecified: Secondary | ICD-10-CM

## 2015-05-14 DIAGNOSIS — N926 Irregular menstruation, unspecified: Secondary | ICD-10-CM

## 2015-05-14 LAB — POCT URINALYSIS DIP (MANUAL ENTRY)
BILIRUBIN UA: NEGATIVE
BILIRUBIN UA: NEGATIVE
GLUCOSE UA: NEGATIVE
Nitrite, UA: POSITIVE — AB
PH UA: 6
Protein Ur, POC: NEGATIVE
Spec Grav, UA: 1.02
Urobilinogen, UA: 0.2

## 2015-05-14 LAB — COMPREHENSIVE METABOLIC PANEL
ALK PHOS: 67 U/L (ref 33–115)
ALT: 8 U/L (ref 6–29)
AST: 13 U/L (ref 10–30)
Albumin: 4.1 g/dL (ref 3.6–5.1)
BUN: 7 mg/dL (ref 7–25)
CO2: 27 mmol/L (ref 20–31)
CREATININE: 0.64 mg/dL (ref 0.50–1.10)
Calcium: 9.6 mg/dL (ref 8.6–10.2)
Chloride: 101 mmol/L (ref 98–110)
GLUCOSE: 80 mg/dL (ref 65–99)
Potassium: 4.4 mmol/L (ref 3.5–5.3)
SODIUM: 137 mmol/L (ref 135–146)
TOTAL PROTEIN: 7.4 g/dL (ref 6.1–8.1)
Total Bilirubin: 0.3 mg/dL (ref 0.2–1.2)

## 2015-05-14 LAB — POCT URINE PREGNANCY: PREG TEST UR: NEGATIVE

## 2015-05-14 LAB — TSH: TSH: 1.32 u[IU]/mL (ref 0.350–4.500)

## 2015-05-14 MED ORDER — NITROFURANTOIN MONOHYD MACRO 100 MG PO CAPS: 100.0000 mg | ORAL_CAPSULE | Freq: Two times a day (BID) | ORAL | Status: AC

## 2015-05-14 NOTE — Patient Instructions (Addendum)
You will get a phone call to schedule appt for thyroid ultrasound. I will call you with your lab results. In the meantime, try taking 4 tums with your meal and see if this helps your nausea and vomiting. Avoid alcohol, tobacco, soft cheese, deli meat, raw meat, cat litter and any over the counter medications (other than tums) in the meantime until we can confirm pregnancy status. Start taking a prenatal vitamin - you should be taking daily anyway since you are trying to get pregnant. Take macrobid twice a day for UTI. Drink plenty of water (at least 64 oz per day). Take antibiotic as prescribed until finished.

## 2015-05-14 NOTE — Progress Notes (Signed)
[facility] [address], [place] [address] [phone]  Date:  05/14/2015   Name:  Monica Armstrong   DOB:  07-01-1991   MRN:  [id number]  PCP:  Abbe Amsterdam, MD    Chief Complaint: wants blood test for pregancy   History of Present Illness:  This is a 24 y.o. female who is presenting wanting a pregnancy test. States for the past 2 weeks she has been nauseated after eating. She is vomiting twice a day - non-bloody. Her breasts have also felt very sore, which is abnormal for her. She is feeling more fatigued and when she is tired she states she occ feels lightheaded. Her cycles are usually 28 days exactly. Last month she was one week late and the period was more like spotting and not like a regular period. Spotting started on 9/22. She is also having infrequent diarrhea, once every 3 days, with normal BMs in between. She does have occ heartburn but none in the past 2 weeks. She was seen in the ED 8/28 for chest pain thought to be d/t heartburn. She does not currently take anything for heartburn. She denies fever, chills, urinary frequency, dysuria, bloody stool, uri sx. She has never been pregnant before. She has been trying to get pregnant for the past 6 months. She took a pregnancy test at home that was negative. She is wanting serum hcg testing today to be sure.  Review of Systems:  Review of Systems See HPI  Patient Active Problem List   Diagnosis Date Noted  . Health care maintenance 06/29/2014    Prior to Admission medications   Not on File    No Known Allergies  History reviewed. No pertinent past surgical history.  Social History  Substance Use Topics  . Smoking status: Never Smoker   . Smokeless tobacco: None  . Alcohol Use: No    Family History  Problem Relation Age of Onset  . Heart disease Paternal Grandmother     Medication list has been reviewed and updated.  Physical Examination:  Physical Exam  Constitutional: She is  oriented to person, place, and time. She appears well-developed and well-nourished. No distress.  HENT:  Head: Normocephalic and atraumatic.  Right Ear: Hearing normal.  Left Ear: Hearing normal.  Nose: Nose normal.  Eyes: Conjunctivae and lids are normal. Right eye exhibits no discharge. Left eye exhibits no discharge. No scleral icterus.  Neck: Thyromegaly (right sided) present.  Cardiovascular: Normal rate, regular rhythm, normal heart sounds and normal pulses.   No murmur heard. Pulmonary/Chest: Effort normal and breath sounds normal. No respiratory distress. She has no wheezes. She has no rhonchi. She has no rales.  Abdominal: Soft. Normal appearance. There is tenderness in the suprapubic area. There is no CVA tenderness.  Musculoskeletal: Normal range of motion.  Lymphadenopathy:       Head (right side): No submental, no submandibular and no tonsillar adenopathy present.       Head (left side): No submental, no submandibular and no tonsillar adenopathy present.    She has no cervical adenopathy.  Neurological: She is alert and oriented to person, place, and time.  Skin: Skin is warm, dry and intact. No lesion and no rash noted.  Psychiatric: She has a normal mood and affect. Her speech is normal and behavior is normal. Thought content normal.   BP 103/69 mmHg  Pulse 78  Temp(Src) 99.1 F (37.3 C) (Oral)  Resp 16  Ht 5' 0.5" (1.537 m)  Wt 120 lb 12.8 oz (54.795 kg)  BMI 23.19 kg/m2  LMP 05/03/2015  Results for orders placed or performed in visit on 05/14/15  POCT urine pregnancy  Result Value Ref Range   Preg Test, Ur Negative Negative  POCT urinalysis dipstick  Result Value Ref Range   Color, UA yellow yellow   Clarity, UA cloudy (A) clear   Glucose, UA negative negative   Bilirubin, UA negative negative   Ketones, POC UA negative negative   Spec Grav, UA 1.020    Blood, UA trace-lysed (A) negative   pH, UA 6.0    Protein Ur, POC negative negative   Urobilinogen,  UA 0.2    Nitrite, UA Positive (A) Negative   Leukocytes, UA large (3+) (A) Negative    Assessment and Plan:  1. Non-intractable vomiting with nausea, unspecified vomiting type 2. Breast tenderness uHCG negative. Serum HCG, CMP and CBC pending. I suspect n/v may be d/t GERD, esp since only occurs after eating. She will try taking tums  (safe during preg) with her meals and see if this helps. We discussed things to avoid in pregnancy in case test is positive. Advised she start taking prenatal vitamins daily even if she is not pregnant. - hCG, serum, qualitative - POCT urine pregnancy - CBC - Comprehensive metabolic panel - POCT urinalysis dipstick  3. Thyromegaly Right sided thyromegaly present. TSH pending. Referred for thyroid u/s. - TSH - US Soft Tissue Head/Neck; Future  4. Cystitis UA suggestive of cystitis. Past urine cultures have showed sensitivity to macrobid. - nitrofurantoin, macrocrystal-monohydrate, (MACROBID) 100 MG capsule; Take 1 capsule (100 mg total) by mouth 2 (two) times daily.  Dispense: 14 capsule; Refill: 0   [place] Miners. Dyke Brackett, MHS [facility]  05/15/2015

## 2015-05-15 LAB — HCG, SERUM, QUALITATIVE: Preg, Serum: NEGATIVE

## 2015-05-15 LAB — CBC
HCT: 39.9 % (ref 36.0–46.0)
Hemoglobin: 13.1 g/dL (ref 12.0–15.0)
MCH: 28.7 pg (ref 26.0–34.0)
MCHC: 32.8 g/dL (ref 30.0–36.0)
MCV: 87.3 fL (ref 78.0–100.0)
MPV: 12.2 fL (ref 8.6–12.4)
PLATELETS: 331 10*3/uL (ref 150–400)
RBC: 4.57 MIL/uL (ref 3.87–5.11)
RDW: 14.4 % (ref 11.5–15.5)
WBC: 5.9 10*3/uL (ref 4.0–10.5)

## 2015-05-18 ENCOUNTER — Ambulatory Visit
Admission: RE | Admit: 2015-05-18 | Discharge: 2015-05-18 | Disposition: A | Discharge status: Home / Self Care | Payer: BLUE CROSS/BLUE SHIELD | Source: Ambulatory Visit | Attending: Physician Assistant | Admitting: Physician Assistant

## 2015-05-18 DIAGNOSIS — E010 Iodine-deficiency related diffuse (endemic) goiter: Secondary | ICD-10-CM

## 2015-05-22 NOTE — Progress Notes (Signed)
Tried to reach Pt. About her labs no answer, left VM to call back

## 2015-07-07 ENCOUNTER — Emergency Department (INDEPENDENT_AMBULATORY_CARE_PROVIDER_SITE_OTHER)
Admission: EM | Admit: 2015-07-07 | Discharge: 2015-07-07 | Disposition: A | Discharge status: Home / Self Care | Payer: BLUE CROSS/BLUE SHIELD | Source: Home / Self Care | Attending: Emergency Medicine | Admitting: Emergency Medicine

## 2015-07-07 ENCOUNTER — Encounter (HOSPITAL_COMMUNITY): Payer: Self-pay | Admitting: Emergency Medicine

## 2015-07-07 DIAGNOSIS — B354 Tinea corporis: Secondary | ICD-10-CM | POA: Diagnosis not present

## 2015-07-07 NOTE — ED Notes (Signed)
C/o rash on right breast onset 2 weeks Denies new meds/hygiene products A&O x4... No acute distress.

## 2015-07-07 NOTE — ED Provider Notes (Signed)
CSN: [id number]     Arrival date & time 07/07/15  1323 History   First MD Initiated Contact with Patient 07/07/15 1451     Chief Complaint  Patient presents with  . Rash   (Consider location/radiation/quality/duration/timing/severity/associated sxs/prior Treatment) HPI Comments: 24 year old female developed an isolated annular rash to the right side of the right breast proximal to 2 weeks ago. She states that it itches and the itching has been getting worse. Denies other areas of a rash or other lesions. Denies systemic symptoms.   History reviewed. No pertinent past medical history. History reviewed. No pertinent past surgical history. Family History  Problem Relation Age of Onset  . Heart disease Paternal Grandmother    Social History  Substance Use Topics  . Smoking status: Never Smoker   . Smokeless tobacco: None  . Alcohol Use: No   OB History    No data available     Review of Systems  Constitutional: Negative.   Skin: Positive for rash.  All other systems reviewed and are negative.   Allergies  Review of patient's allergies indicates no known allergies.  Home Medications   Prior to Admission medications   Not on File   Meds Ordered and Administered this Visit  Medications - No data to display  BP 107/72 mmHg  Pulse 72  Temp(Src) 98 F (36.7 C) (Oral)  Resp 18  SpO2 99%  LMP 07/03/2015 No data found.   Physical Exam  Constitutional: She is oriented to person, place, and time. She appears well-developed and well-nourished. No distress.  Eyes: EOM are normal.  Cardiovascular: Normal rate.   Pulmonary/Chest: Effort normal. No respiratory distress.  Musculoskeletal: She exhibits no edema.  Neurological: She is alert and oriented to person, place, and time. She exhibits normal muscle tone.  Skin: Skin is warm and dry.  There is an approximately 2 and half centimeter annular lesion to the right breast, lateral aspect. It is scaly particularly around the  edges and rough in texture. Minor erythema. Some central clearing. No drainage or bleeding. No underlying induration, lumps or bumps. Hinda KehrMaranda, technologist present during exam.  Psychiatric: She has a normal mood and affect.  Nursing note and vitals reviewed.   ED Course  Procedures (including critical care time)  Labs Review Labs Reviewed - No data to display  Imaging Review No results found.   Visual Acuity Review  Right Eye Distance:   Left Eye Distance:   Bilateral Distance:    Right Eye Near:   Left Eye Near:    Bilateral Near:         MDM   1. Ringworm of body    Body Ringworm Use Lamisil cream, over-the-counter, 2-3 times a day on the rash of the right breast. Use the cream for at least 2 weeks and for approximately 5 days after it appears to have completely gone. For the first few days may use hydrocortisone 1% cream for itching. Apply this after you have applied the Lamisil Cream and it is dried. After showering make sure the area is dried well with a hair dryer. Follow-up with your primary care doctor if this is not getting better.    Hayden Rasmussenavid Lynise Porr, NP 07/07/15 [phone]

## 2015-07-07 NOTE — Discharge Instructions (Signed)
Body Ringworm Use Lamisil cream, over-the-counter, 2-3 times a day on the rash of the right breast. Use the cream for at least 2 weeks and for approximately 5 days after it appears to have completely gone. For the first few days may use hydrocortisone 1% cream for itching. Apply this after you have applied the Lamisil Cream and it is dried. After showering make sure the area is dried well with a hair dryer. Follow-up with your primary care doctor if this is not getting better. Ringworm (tinea corporis) is a fungal infection of the skin on the body. This infection is not caused by worms, but is actually caused by a fungus. Fungus normally lives on the top of your skin and can be useful. However, in the case of ringworms, the fungus grows out of control and causes a skin infection. It can involve any area of skin on the body and can spread easily from one person to another (contagious). Ringworm is a common problem for children, but it can affect adults as well. Ringworm is also often found in athletes, especially wrestlers who share equipment and mats.  CAUSES  Ringworm of the body is caused by a fungus called dermatophyte. It can spread by:  Touchingother people who are infected.  Touchinginfected pets.  Touching or sharingobjects that have been in contact with the infected person or pet (hats, combs, towels, clothing, sports equipment). SYMPTOMS   Itchy, raised red spots and bumps on the skin.  Ring-shaped rash.  Redness near the border of the rash with a clear center.  Dry and scaly skin on or around the rash. Not every person develops a ring-shaped rash. Some develop only the red, scaly patches. DIAGNOSIS  Most often, ringworm can be diagnosed by performing a skin exam. Your caregiver may choose to take a skin scraping from the affected area. The sample will be examined under the microscope to see if the fungus is present.  TREATMENT  Body ringworm may be treated with a topical  antifungal cream or ointment. Sometimes, an antifungal shampoo that can be used on your body is prescribed. You may be prescribed antifungal medicines to take by mouth if your ringworm is severe, keeps coming back, or lasts a long time.  HOME CARE INSTRUCTIONS   Only take over-the-counter or prescription medicines as directed by your caregiver.  Wash the infected area and dry it completely before applying yourcream or ointment.  When using antifungal shampoo to treat the ringworm, leave the shampoo on the body for 3-5 minutes before rinsing.   Wear loose clothing to stop clothes from rubbing and irritating the rash.  Wash or change your bed sheets every night while you have the rash.  Have your pet treated by your veterinarian if it has the same infection. To prevent ringworm:   Practice good hygiene.  Wear sandals or shoes in public places and showers.  Do not share personal items with others.  Avoid touching red patches of skin on other people.  Avoid touching pets that have bald spots or wash your hands after doing so. SEEK MEDICAL CARE IF:   Your rash continues to spread after 7 days of treatment.  Your rash is not gone in 4 weeks.  The area around your rash becomes red, warm, tender, and swollen.   This information is not intended to replace advice given to you by your health care provider. Make sure you discuss any questions you have with your health care provider.   Document Released:  07/25/2000 Document Revised: 04/21/2012 Document Reviewed: 02/09/2012 Elsevier Interactive Patient Education [employer]2016 Elsevier Inc.

## 2015-07-18 ENCOUNTER — Emergency Department (HOSPITAL_COMMUNITY)
Admission: EM | Admit: 2015-07-18 | Discharge: 2015-07-18 | Disposition: A | Discharge status: Home / Self Care | Payer: BLUE CROSS/BLUE SHIELD | Attending: Emergency Medicine | Admitting: Emergency Medicine

## 2015-07-18 ENCOUNTER — Encounter (HOSPITAL_COMMUNITY): Payer: Self-pay | Admitting: Emergency Medicine

## 2015-07-18 DIAGNOSIS — B354 Tinea corporis: Secondary | ICD-10-CM | POA: Insufficient documentation

## 2015-07-18 DIAGNOSIS — Z79899 Other long term (current) drug therapy: Secondary | ICD-10-CM | POA: Insufficient documentation

## 2015-07-18 DIAGNOSIS — R21 Rash and other nonspecific skin eruption: Secondary | ICD-10-CM | POA: Diagnosis present

## 2015-07-18 MED ORDER — CLOTRIMAZOLE 1 % EX CREA: TOPICAL_CREAM | CUTANEOUS | Status: DC

## 2015-07-18 NOTE — ED Provider Notes (Signed)
CSN: [id number]     Arrival date & time 07/18/15  1328 History  By signing my name below, I, Elon SpannerGarrett Cook, attest that this documentation has been prepared under the direction and in the presence of Everlene FarrierWilliam Breyton Vanscyoc, PA-C. Electronically Signed: Elon SpannerGarrett Cook ED Scribe. 07/18/2015. 3:36 PM.    Chief Complaint  Patient presents with  . Rash   The history is provided by the patient. No language interpreter was used.   HPI Comments: Monica Armstrong is a 24 y.o. female who presents to the Emergency Department complaining of two isolated, itching, expanding areas of rash on the right breast and left upper chest onset 3 weeks ago.  The patient was seen in the ED on 11/26 for same complaint where she was diagnosed with ring worm.  She was told to use OTC Lamisal and 1% hydrocortisone which she has done and her rash has only gotten bigger.  She denies fever, chills, cough, abdominal pain.     History reviewed. No pertinent past medical history. History reviewed. No pertinent past surgical history. Family History  Problem Relation Age of Onset  . Heart disease Paternal Grandmother    Social History  Substance Use Topics  . Smoking status: Never Smoker   . Smokeless tobacco: None  . Alcohol Use: No   OB History    No data available     Review of Systems  Constitutional: Negative for fever and chills.  Respiratory: Negative for cough.   Gastrointestinal: Negative for abdominal pain.  Skin: Positive for rash.      Allergies  Review of patient's allergies indicates no known allergies.  Home Medications   Prior to Admission medications   Medication Sig Start Date End Date Taking? Authorizing Provider  clotrimazole (LOTRIMIN) 1 % cream Apply to affected area 2 times daily 07/18/15   Everlene FarrierWilliam Arlana Canizales, PA-C   BP 103/64 mmHg  Pulse 70  Temp(Src) 98.3 F (36.8 C) (Oral)  Resp 18  Ht 4\' 11"  (1.499 m)  Wt 54.432 kg  BMI 24.22 kg/m2  SpO2 100%  LMP 07/03/2015 Physical Exam   Constitutional: She appears well-developed and well-nourished. No distress.  Nontoxic appearing.  HENT:  Head: Normocephalic and atraumatic.  Eyes: Conjunctivae are normal. Right eye exhibits no discharge. Left eye exhibits no discharge.  Neck: Neck supple.  Cardiovascular: Normal rate, regular rhythm, normal heart sounds and intact distal pulses.   Pulmonary/Chest: Effort normal and breath sounds normal. No respiratory distress.  Abdominal: Soft. There is no tenderness.  Lymphadenopathy:    She has no cervical adenopathy.  Neurological: She is alert. Coordination normal.  Skin: Skin is warm and dry. No rash noted. She is not diaphoretic.  3 cm circular scaling plaque under right breast with central clearing.  1 cm scaling plaque on left chest wall.  Not warm or tender to touch.  No vesicles.  No bulla.   Psychiatric: She has a normal mood and affect. Her behavior is normal.  Nursing note and vitals reviewed.   ED Course  Procedures (including critical care time)  DIAGNOSTIC STUDIES: Oxygen Saturation is 100% on RA, normal by my interpretation.    COORDINATION OF CARE:  3:37 PM Will prescribe anti-fungal.  Patient acknowledges and agrees with plan.    Labs Review Labs Reviewed - No data to display  Imaging Review No results found.    EKG Interpretation None      Filed Vitals:   07/18/15 1354 07/18/15 1513  BP: 122/72 103/64  Pulse: 83 70  Temp: 98.7 F (37.1 C) 98.3 F (36.8 C)  TempSrc: Oral Oral  Resp:  18  Height:  (1.499 m)   Weight: 54.432 kg   SpO2: 98% 100%     MDM   Meds given in ED:  Medications - No data to display  Discharge Medication List as of 07/18/2015  3:44 PM    START taking these medications   Details  clotrimazole (LOTRIMIN) 1 % cream Apply to affected area 2 times daily, Print        Final diagnoses:  Tinea corporis   This  is a 24 y.o. female who presents to the Emergency Department complaining of two isolated,  itching, expanding areas of rash on the right breast and left upper chest onset 3 weeks ago.  The patient was seen in the ED on 11/26 for same complaint where she was diagnosed with ring worm.  She was told to use OTC Lamisal and 1% hydrocortisone which she has done and her rash has only gotten bigger. She denies fevers or new lesions.  On exam the patient is afebrile and non-toxic appearing. She has a 3 cm circular scaling plaque under right breast with central clearing.  1 cm scaling plaque on left chest wall.  Not warm or tender to touch.  No vesicles.  No bulla.  This is c/w tinea corporis. I question if the steroid cream was causing this to increase in size. Will stop steroid and change to clotrimazole 1% cream. I encouraged her to follow up with her PCP. I advised the patient to follow-up with their primary care provider this week. I advised the patient to return to the emergency department with new or worsening symptoms or new concerns. The patient verbalized understanding and agreement with plan.    I personally performed the services described in this documentation, which was scribed in my presence. The recorded information has been reviewed and is accurate.      Everlene Farrier, PA-C 07/18/15 1615  Dione Booze, MD 07/18/15 [phone]

## 2015-07-18 NOTE — Discharge Instructions (Signed)

## 2015-07-18 NOTE — ED Notes (Signed)
PA at bedside with RN.  Please see PA assessment for secondary

## 2015-07-18 NOTE — ED Notes (Signed)
PA at bedside.

## 2015-07-18 NOTE — ED Notes (Signed)
Registration at bedside.

## 2015-07-18 NOTE — ED Notes (Signed)
Pt able to ambulate independently 

## 2015-07-18 NOTE — ED Notes (Signed)
Pt has a red rash under right breast that was treated with a steroid cream and antifungal cream and is no better. Pt now has a same rash on upper left chest.

## 2015-08-31 ENCOUNTER — Encounter: Payer: Self-pay | Admitting: Family Medicine

## 2015-09-05 ENCOUNTER — Encounter: Payer: Self-pay | Admitting: Family Medicine

## 2015-12-03 ENCOUNTER — Encounter (HOSPITAL_COMMUNITY): Payer: Self-pay

## 2015-12-03 DIAGNOSIS — R109 Unspecified abdominal pain: Secondary | ICD-10-CM | POA: Insufficient documentation

## 2015-12-03 DIAGNOSIS — N644 Mastodynia: Secondary | ICD-10-CM | POA: Insufficient documentation

## 2015-12-03 LAB — COMPREHENSIVE METABOLIC PANEL
ALBUMIN: 3.6 g/dL (ref 3.5–5.0)
ALT: 11 U/L — AB (ref 14–54)
AST: 19 U/L (ref 15–41)
Alkaline Phosphatase: 76 U/L (ref 38–126)
Anion gap: 9 (ref 5–15)
BUN: 10 mg/dL (ref 6–20)
CHLORIDE: 106 mmol/L (ref 101–111)
CO2: 25 mmol/L (ref 22–32)
CREATININE: 0.72 mg/dL (ref 0.44–1.00)
Calcium: 9.3 mg/dL (ref 8.9–10.3)
GFR calc Af Amer: >60 mL/min (ref >60)
GFR calc non Af Amer: >60 mL/min (ref >60)
Glucose, Bld: 72 mg/dL (ref 65–99)
POTASSIUM: 4.1 mmol/L (ref 3.5–5.1)
SODIUM: 140 mmol/L (ref 135–145)
Total Bilirubin: 0.3 mg/dL (ref 0.3–1.2)
Total Protein: 8 g/dL (ref 6.5–8.1)

## 2015-12-03 LAB — URINALYSIS, ROUTINE W REFLEX MICROSCOPIC
Bilirubin Urine: NEGATIVE
GLUCOSE, UA: NEGATIVE mg/dL
HGB URINE DIPSTICK: NEGATIVE
KETONES UR: NEGATIVE mg/dL
Nitrite: NEGATIVE
PH: 6 (ref 5.0–8.0)
PROTEIN: NEGATIVE mg/dL
Specific Gravity, Urine: 1.031 — ABNORMAL HIGH (ref 1.005–1.030)

## 2015-12-03 LAB — CBC
HEMATOCRIT: 40.7 % (ref 36.0–46.0)
Hemoglobin: 13.8 g/dL (ref 12.0–15.0)
MCH: 29.2 pg (ref 26.0–34.0)
MCHC: 33.9 g/dL (ref 30.0–36.0)
MCV: 86 fL (ref 78.0–100.0)
PLATELETS: 280 10*3/uL (ref 150–400)
RBC: 4.73 MIL/uL (ref 3.87–5.11)
RDW: 13.5 % (ref 11.5–15.5)
WBC: 7.1 10*3/uL (ref 4.0–10.5)

## 2015-12-03 LAB — LIPASE, BLOOD: LIPASE: 38 U/L (ref 11–51)

## 2015-12-03 LAB — I-STAT BETA HCG BLOOD, ED (MC, WL, AP ONLY): I-stat hCG, quantitative: <5 m[IU]/mL (ref <5)

## 2015-12-03 LAB — URINE MICROSCOPIC-ADD ON: RBC / HPF: NONE SEEN RBC/hpf (ref 0–5)

## 2015-12-03 NOTE — ED Notes (Signed)
Patient here with 1 week of breast pain and bilateral side and flank pain x 3 days. Reports some unusual bleeding described as light brown x 1 day

## 2015-12-03 NOTE — ED Notes (Addendum)
No answer for vital sign recheck. 

## 2015-12-04 ENCOUNTER — Emergency Department (HOSPITAL_COMMUNITY)
Admission: EM | Admit: 2015-12-04 | Discharge: 2015-12-04 | Disposition: A | Discharge status: Home / Self Care | Payer: Managed Care, Other (non HMO) | Attending: Emergency Medicine | Admitting: Emergency Medicine

## 2015-12-04 ENCOUNTER — Encounter (HOSPITAL_COMMUNITY): Payer: Self-pay

## 2015-12-04 DIAGNOSIS — N644 Mastodynia: Secondary | ICD-10-CM | POA: Insufficient documentation

## 2015-12-04 DIAGNOSIS — N3090 Cystitis, unspecified without hematuria: Secondary | ICD-10-CM | POA: Insufficient documentation

## 2015-12-04 DIAGNOSIS — R109 Unspecified abdominal pain: Secondary | ICD-10-CM | POA: Diagnosis present

## 2015-12-04 LAB — WET PREP, GENITAL
SPERM: NONE SEEN
TRICH WET PREP: NONE SEEN
Yeast Wet Prep HPF POC: NONE SEEN

## 2015-12-04 MED ORDER — NITROFURANTOIN MONOHYD MACRO 100 MG PO CAPS: 100.0000 mg | ORAL_CAPSULE | Freq: Two times a day (BID) | ORAL | Status: DC

## 2015-12-04 NOTE — ED Notes (Addendum)
Pt in ED yesterday with blood and urine. Did not stay for results.  Pt states she did not want to wait.  Pt has changes in urine sample.  Encouraged to stay today to see MD/PA.  Pt also states she had bloody watery discharge  Yesterday morning.  Unsure if it was urinary or vaginal.  No blood in urine.  Will triage for abdominal pain.

## 2015-12-04 NOTE — Discharge Instructions (Signed)

## 2015-12-04 NOTE — ED Provider Notes (Signed)
CSN: [id number]     Arrival date & time 12/04/15  1255 History   First MD Initiated Contact with Patient 12/04/15 1651     Chief Complaint  Patient presents with  . Abdominal Pain     Patient is a 25 y.o. female presenting with abdominal pain. The history is provided by the patient. No language interpreter was used.  Abdominal Pain  Durward ParcelSharay Wachs is a 25 y.o. female who presents to the Emergency Department complaining of abdominal pain.  She reports 1 week of bilateral low back pain. 2 weeks of breast pain. Yesterday she went to urinate and had some watery pink/bloody vaginal discharge. She has some mild suprapubic discomfort. No fevers, vomiting. She is sexually active but does use protection. She has pain at the end of urination.  History reviewed. No pertinent past medical history. History reviewed. No pertinent past surgical history. Family History  Problem Relation Age of Onset  . Heart disease Paternal Grandmother    Social History  Substance Use Topics  . Smoking status: Never Smoker   . Smokeless tobacco: None  . Alcohol Use: No   OB History    No data available     Review of Systems  Gastrointestinal: Positive for abdominal pain.  All other systems reviewed and are negative.     Allergies  Review of patient's allergies indicates no known allergies.  Home Medications   Prior to Admission medications   Medication Sig Start Date End Date Taking? Authorizing Provider  clotrimazole (LOTRIMIN) 1 % cream Apply to affected area 2 times daily Patient not taking: Reported on 12/04/2015 07/18/15   Everlene FarrierWilliam Dansie, PA-C  nitrofurantoin, macrocrystal-monohydrate, (MACROBID) 100 MG capsule Take 1 capsule (100 mg total) by mouth 2 (two) times daily. 12/04/15   Tilden FossaElizabeth Calil Amor, MD   BP 115/79 mmHg  Pulse 70  Temp(Src) 98.2 F (36.8 C) (Oral)  Resp 16  SpO2 100%  LMP 11/12/2015 Physical Exam  Constitutional: She is oriented to person, place, and time. She appears  well-developed and well-nourished.  HENT:  Head: Normocephalic and atraumatic.  Cardiovascular: Normal rate and regular rhythm.   No murmur heard. Pulmonary/Chest: Effort normal and breath sounds normal. No respiratory distress.  Abdominal: Soft. There is no rebound and no guarding.  Mild suprapubic tenderness  Musculoskeletal: She exhibits no edema or tenderness.  Neurological: She is alert and oriented to person, place, and time.  Skin: Skin is warm and dry.  Psychiatric: She has a normal mood and affect. Her behavior is normal.  Nursing note and vitals reviewed.   ED Course  Procedures (including critical care time) Labs Review Labs Reviewed  WET PREP, GENITAL - Abnormal; Notable for the following:    Clue Cells Wet Prep HPF POC PRESENT (*)    WBC, Wet Prep HPF POC FEW (*)    All other components within normal limits  RPR  HIV ANTIBODY (ROUTINE TESTING)  GC/CHLAMYDIA PROBE AMP ([place]    Imaging Review No results found. I have personally reviewed and evaluated these images and lab results as part of my medical decision-making.   EKG Interpretation None      MDM   Final diagnoses:  Cystitis  Patient here for lower abdominal pain, low back pain, pain with urination. Pelvic exam is benign. He is concerning for cystitis. Patient to leave the department prior to getting wet prep results. Discussed home care and return precautions for urinary tract infection.  Tilden FossaElizabeth Henslee Lottman, MD 12/05/15 [phone]

## 2015-12-04 NOTE — ED Notes (Signed)
No answer for room placement.

## 2015-12-05 LAB — HIV ANTIBODY (ROUTINE TESTING W REFLEX): HIV SCREEN 4TH GENERATION: NONREACTIVE

## 2015-12-05 LAB — GC/CHLAMYDIA PROBE AMP (~~LOC~~) NOT AT ARMC
Chlamydia: NEGATIVE
Neisseria Gonorrhea: NEGATIVE

## 2015-12-05 LAB — RPR: RPR: NONREACTIVE

## 2015-12-22 ENCOUNTER — Emergency Department (HOSPITAL_COMMUNITY)
Admission: EM | Admit: 2015-12-22 | Discharge: 2015-12-22 | Disposition: A | Discharge status: Home / Self Care | Payer: Managed Care, Other (non HMO) | Attending: Emergency Medicine | Admitting: Emergency Medicine

## 2015-12-22 ENCOUNTER — Encounter (HOSPITAL_COMMUNITY): Payer: Self-pay | Admitting: *Deleted

## 2015-12-22 DIAGNOSIS — Z792 Long term (current) use of antibiotics: Secondary | ICD-10-CM | POA: Diagnosis not present

## 2015-12-22 DIAGNOSIS — J029 Acute pharyngitis, unspecified: Secondary | ICD-10-CM | POA: Diagnosis not present

## 2015-12-22 LAB — RAPID STREP SCREEN (MED CTR MEBANE ONLY): STREPTOCOCCUS, GROUP A SCREEN (DIRECT): NEGATIVE

## 2015-12-22 MED ORDER — OXYCODONE-ACETAMINOPHEN 5-325 MG PO TABS: 1.0000 | ORAL_TABLET | ORAL | Status: DC | PRN

## 2015-12-22 MED ORDER — DEXAMETHASONE 4 MG PO TABS
12.0000 mg | ORAL_TABLET | Freq: Once | ORAL | Status: AC
  Administered 2015-12-22: 12 mg via ORAL
  Filled 2015-12-22: qty 3

## 2015-12-22 NOTE — ED Notes (Signed)
C/o sore throat x 1 week.

## 2015-12-22 NOTE — Discharge Instructions (Signed)
Drink plenty of fluids. Use lozenges and throat sprays as needed. Return if pain is getting worse, or if you are unable to swallow.  Pharyngitis Pharyngitis is redness, pain, and swelling (inflammation) of your pharynx.  CAUSES  Pharyngitis is usually caused by infection. Most of the time, these infections are from viruses (viral) and are part of a cold. However, sometimes pharyngitis is caused by bacteria (bacterial). Pharyngitis can also be caused by allergies. Viral pharyngitis may be spread from person to person by coughing, sneezing, and personal items or utensils (cups, forks, spoons, toothbrushes). Bacterial pharyngitis may be spread from person to person by more intimate contact, such as kissing.  SIGNS AND SYMPTOMS  Symptoms of pharyngitis include:   Sore throat.   Tiredness (fatigue).   Low-grade fever.   Headache.  Joint pain and muscle aches.  Skin rashes.  Swollen lymph nodes.  Plaque-like film on throat or tonsils (often seen with bacterial pharyngitis). DIAGNOSIS  Your health care provider will ask you questions about your illness and your symptoms. Your medical history, along with a physical exam, is often all that is needed to diagnose pharyngitis. Sometimes, a rapid strep test is done. Other lab tests may also be done, depending on the suspected cause.  TREATMENT  Viral pharyngitis will usually get better in 3-4 days without the use of medicine. Bacterial pharyngitis is treated with medicines that kill germs (antibiotics).  HOME CARE INSTRUCTIONS   Drink enough water and fluids to keep your urine clear or pale yellow.   Only take over-the-counter or prescription medicines as directed by your health care provider:   If you are prescribed antibiotics, make sure you finish them even if you start to feel better.   Do not take aspirin.   Get lots of rest.   Gargle with 8 oz of salt water ( tsp of salt per 1 qt of water) as often as every 1-2 hours to  soothe your throat.   Throat lozenges (if you are not at risk for choking) or sprays may be used to soothe your throat. SEEK MEDICAL CARE IF:   You have large, tender lumps in your neck.  You have a rash.  You cough up green, yellow-brown, or bloody spit. SEEK IMMEDIATE MEDICAL CARE IF:   Your neck becomes stiff.  You drool or are unable to swallow liquids.  You vomit or are unable to keep medicines or liquids down.  You have severe pain that does not go away with the use of recommended medicines.  You have trouble breathing (not caused by a stuffy nose). MAKE SURE YOU:   Understand these instructions.  Will watch your condition.  Will get help right away if you are not doing well or get worse.   This information is not intended to replace advice given to you by your health care provider. Make sure you discuss any questions you have with your health care provider.   Document Released: 07/28/2005 Document Revised: 05/18/2013 Document Reviewed: 04/04/2013 Elsevier Interactive Patient Education 2016 [employer]  Acetaminophen; Oxycodone tablets What is this medicine? ACETAMINOPHEN; OXYCODONE (a set a MEE noe fen; ox i KOE done) is a pain reliever. It is used to treat moderate to severe pain. This medicine may be used for other purposes; ask your health care provider or pharmacist if you have questions. What should I tell my health care provider before I take this medicine? They need to know if you have any of these conditions: -brain tumor -Crohn's disease,  inflammatory bowel disease, or ulcerative colitis -drug abuse or addiction -head injury -heart or circulation problems -if you often drink alcohol -kidney disease or problems going to the bathroom -liver disease -lung disease, asthma, or breathing problems -an unusual or allergic reaction to acetaminophen, oxycodone, other opioid analgesics, other medicines, foods, dyes, or preservatives -pregnant or trying to  get pregnant -breast-feeding How should I use this medicine? Take this medicine by mouth with a full glass of water. Follow the directions on the prescription label. You can take it with or without food. If it upsets your stomach, take it with food. Take your medicine at regular intervals. Do not take it more often than directed. Talk to your pediatrician regarding the use of this medicine in children. Special care may be needed. Patients over 23 years old may have a stronger reaction and need a smaller dose. Overdosage: If you think you have taken too much of this medicine contact a poison control center or emergency room at once. NOTE: This medicine is only for you. Do not share this medicine with others. What if I miss a dose? If you miss a dose, take it as soon as you can. If it is almost time for your next dose, take only that dose. Do not take double or extra doses. What may interact with this medicine? -alcohol -antihistamines -barbiturates like amobarbital, butalbital, butabarbital, methohexital, pentobarbital, phenobarbital, thiopental, and secobarbital -benztropine -drugs for bladder problems like solifenacin, trospium, oxybutynin, tolterodine, hyoscyamine, and methscopolamine -drugs for breathing problems like ipratropium and tiotropium -drugs for certain stomach or intestine problems like propantheline, homatropine methylbromide, glycopyrrolate, atropine, belladonna, and dicyclomine -general anesthetics like etomidate, ketamine, nitrous oxide, propofol, desflurane, enflurane, halothane, isoflurane, and sevoflurane -medicines for depression, anxiety, or psychotic disturbances -medicines for sleep -muscle relaxants -naltrexone -narcotic medicines (opiates) for pain -phenothiazines like perphenazine, thioridazine, chlorpromazine, mesoridazine, fluphenazine, prochlorperazine, promazine, and trifluoperazine -scopolamine -tramadol -trihexyphenidyl This list may not describe all  possible interactions. Give your health care provider a list of all the medicines, herbs, non-prescription drugs, or dietary supplements you use. Also tell them if you smoke, drink alcohol, or use illegal drugs. Some items may interact with your medicine. What should I watch for while using this medicine? Tell your doctor or health care professional if your pain does not go away, if it gets worse, or if you have new or a different type of pain. You may develop tolerance to the medicine. Tolerance means that you will need a higher dose of the medication for pain relief. Tolerance is normal and is expected if you take this medicine for a long time. Do not suddenly stop taking your medicine because you may develop a severe reaction. Your body becomes used to the medicine. This does NOT mean you are addicted. Addiction is a behavior related to getting and using a drug for a non-medical reason. If you have pain, you have a medical reason to take pain medicine. Your doctor will tell you how much medicine to take. If your doctor wants you to stop the medicine, the dose will be slowly lowered over time to avoid any side effects. You may get drowsy or dizzy. Do not drive, use machinery, or do anything that needs mental alertness until you know how this medicine affects you. Do not stand or sit up quickly, especially if you are an older patient. This reduces the risk of dizzy or fainting spells. Alcohol may interfere with the effect of this medicine. Avoid alcoholic drinks. There are different types  of narcotic medicines (opiates) for pain. If you take more than one type at the same time, you may have more side effects. Give your health care provider a list of all medicines you use. Your doctor will tell you how much medicine to take. Do not take more medicine than directed. Call emergency for help if you have problems breathing. The medicine will cause constipation. Try to have a bowel movement at least every 2 to 3  days. If you do not have a bowel movement for 3 days, call your doctor or health care professional. Do not take Tylenol (acetaminophen) or medicines that have acetaminophen with this medicine. Too much acetaminophen can be very dangerous. Many nonprescription medicines contain acetaminophen. Always read the labels carefully to avoid taking more acetaminophen. What side effects may I notice from receiving this medicine? Side effects that you should report to your doctor or health care professional as soon as possible: -allergic reactions like skin rash, itching or hives, swelling of the face, lips, or tongue -breathing difficulties, wheezing -confusion -light headedness or fainting spells -severe stomach pain -unusually weak or tired -yellowing of the skin or the whites of the eyes Side effects that usually do not require medical attention (report to your doctor or health care professional if they continue or are bothersome): -dizziness -drowsiness -nausea -vomiting This list may not describe all possible side effects. Call your doctor for medical advice about side effects. You may report side effects to FDA at 1-800-FDA-1088. Where should I keep my medicine? Keep out of the reach of children. This medicine can be abused. Keep your medicine in a safe place to protect it from theft. Do not share this medicine with anyone. Selling or giving away this medicine is dangerous and against the law. This medicine may cause accidental overdose and death if it taken by other adults, children, or pets. Mix any unused medicine with a substance like cat litter or coffee grounds. Then throw the medicine away in a sealed container like a sealed bag or a coffee can with a lid. Do not use the medicine after the expiration date. Store at room temperature between 20 and 25 degrees C (68 and 77 degrees F). NOTE: This sheet is a summary. It may not cover all possible information. If you have questions about this  medicine, talk to your doctor, pharmacist, or health care provider.    2016, Elsevier/Gold Standard. (2014-06-28 15:18:46)

## 2015-12-22 NOTE — ED Provider Notes (Signed)
CSN: [id number]     Arrival date & time 12/22/15  0351 History   First MD Initiated Contact with Patient 12/22/15 0524     Chief Complaint  Patient presents with  . Sore Throat     (Consider location/radiation/quality/duration/timing/severity/associated sxs/prior Treatment) Patient is a 25 y.o. female presenting with pharyngitis. The history is provided by the patient.  Sore Throat  She complains of a sore throat for the last 5 days. Pain is only on the left side and is worse when she swallows. She rates pain at 7/10. She denies fever, chills, sweats. She denies cough. She states that she has been taking Mucinex which does give her relief for a few hours. She has tried saltwater gargles with no relief. She has had no known sick contacts.   History reviewed. No pertinent past medical history. History reviewed. No pertinent past surgical history. Family History  Problem Relation Age of Onset  . Heart disease Paternal Grandmother    Social History  Substance Use Topics  . Smoking status: Never Smoker   . Smokeless tobacco: None  . Alcohol Use: No   OB History    No data available     Review of Systems  All other systems reviewed and are negative.     Allergies  Review of patient's allergies indicates no known allergies.  Home Medications   Prior to Admission medications   Medication Sig Start Date End Date Taking? Authorizing Provider  clotrimazole (LOTRIMIN) 1 % cream Apply to affected area 2 times daily Patient not taking: Reported on 12/04/2015 07/18/15   Everlene Farrier, PA-C  nitrofurantoin, macrocrystal-monohydrate, (MACROBID) 100 MG capsule Take 1 capsule (100 mg total) by mouth 2 (two) times daily. 12/04/15   Tilden Fossa, MD   BP 141/87 mmHg  Pulse 92  Temp(Src) 98.2 F (36.8 C) (Oral)  Resp 15  Ht  (1.499 m)  Wt 127 lb (57.607 kg)  BMI 25.64 kg/m2  SpO2 100%  LMP 12/12/2015 Physical Exam  Nursing note and vitals reviewed.  25 year old female,  resting comfortably and in no acute distress. Vital signs are significant for borderline hypertension. Oxygen saturation is 100%, which is normal. Head is normocephalic and atraumatic. PERRLA, EOMI. Oropharynx shows mild tonsillar erythema and hypertrophy without exudate. There is no pooling of secretions and phonation is normal. Uvula retracts in the midline. Neck is nontender and supple with left-sided anterior cervical adenopathy. Back is nontender and there is no CVA tenderness. Lungs are clear without rales, wheezes, or rhonchi. Chest is nontender. Heart has regular rate and rhythm without murmur. Abdomen is soft, flat, nontender without masses or hepatosplenomegaly and peristalsis is normoactive. Extremities have no cyanosis or edema, full range of motion is present. Skin is warm and dry without rash. Neurologic: Mental status is normal, cranial nerves are intact, there are no motor or sensory deficits.  ED Course  Procedures (including critical care time) Labs Review Results for orders placed or performed during the hospital encounter of 12/22/15  Rapid strep screen  Result Value Ref Range   Streptococcus, Group A Screen (Direct) NEGATIVE NEGATIVE   I have personally reviewed and evaluated these lab results as part of my medical decision-making.   MDM   Final diagnoses:  Pharyngitis    Pharyngitis with negative strep screen. No sign of peritonsillar abscess. She is given dose of dexamethasone and is discharged with prescription for a one-day supply of oxycodone-acetaminophen. Follow-up with PCP if not improving. Return precautions discussed.  Dione Boozeavid Milad Bublitz, MD 12/22/15 [phone]

## 2015-12-23 LAB — CULTURE, GROUP A STREP (THRC)

## 2016-03-07 IMAGING — US US PELVIS COMPLETE
1 series · 14 of 25 positions shown · non-contrast
Comparison: None in PACs

CLINICAL DATA: Follow-up ovarian cyst

EXAM:
TRANSABDOMINAL AND TRANSVAGINAL ULTRASOUND OF PELVIS
TECHNIQUE: Both transabdominal and transvaginal ultrasound examinations of the
pelvis were performed. Transabdominal technique was performed for
global imaging of the pelvis including uterus, ovaries, adnexal
regions, and pelvic cul-de-sac. It was necessary to proceed with
endovaginal exam following the transabdominal exam to visualize the
endometrium and adnexal structures.

[Series 1: us pelvis complete · 0.30mm/px · 14 of 52 slices shown]
[im 1/52]
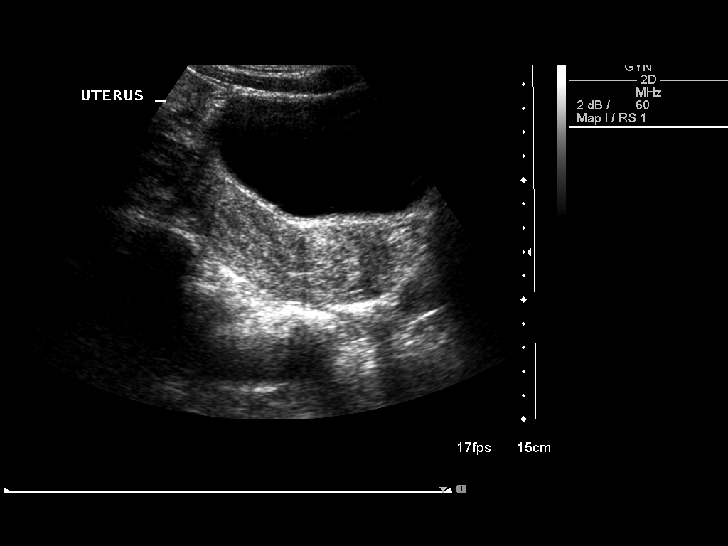
[im 5/52]
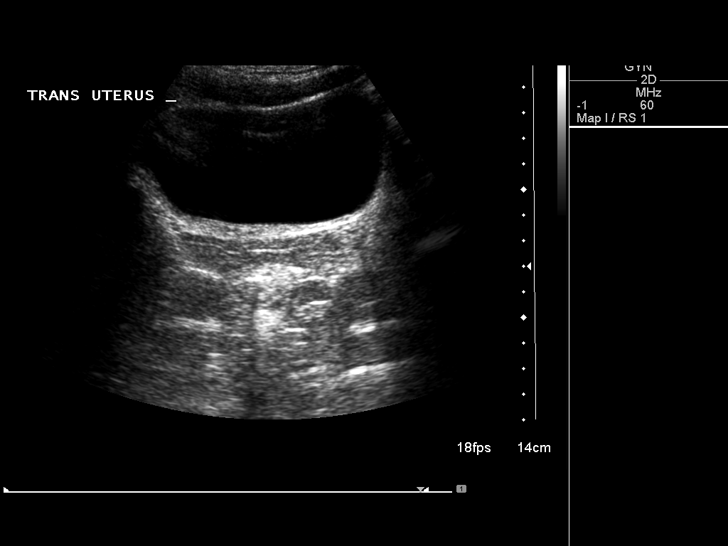
[im 9/52]
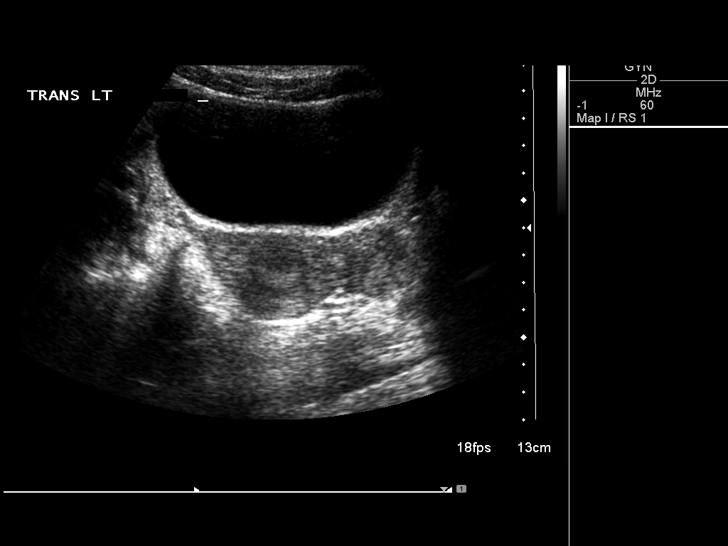
[im 13/52]
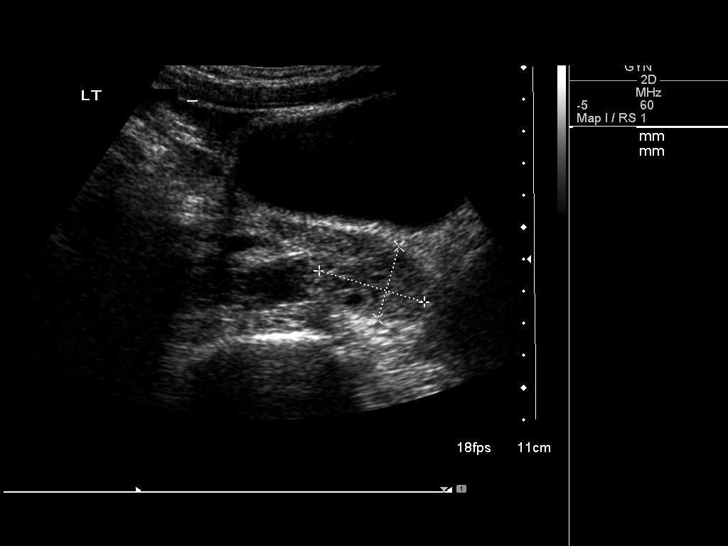
[im 18/52]
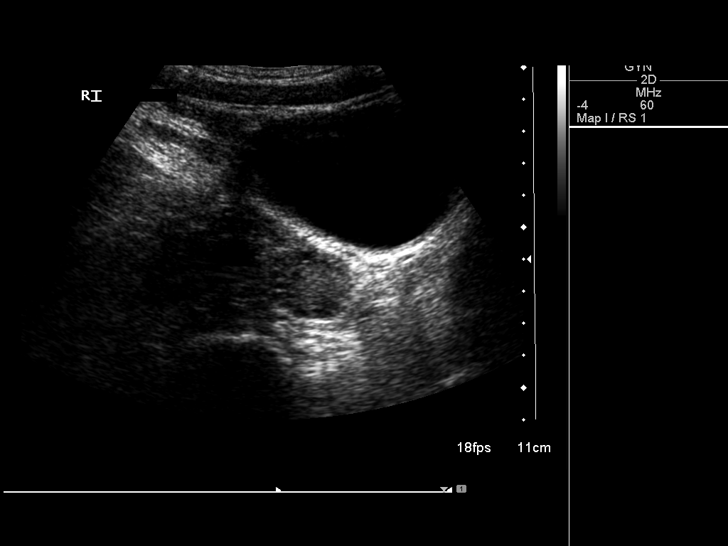
[im 20/52]
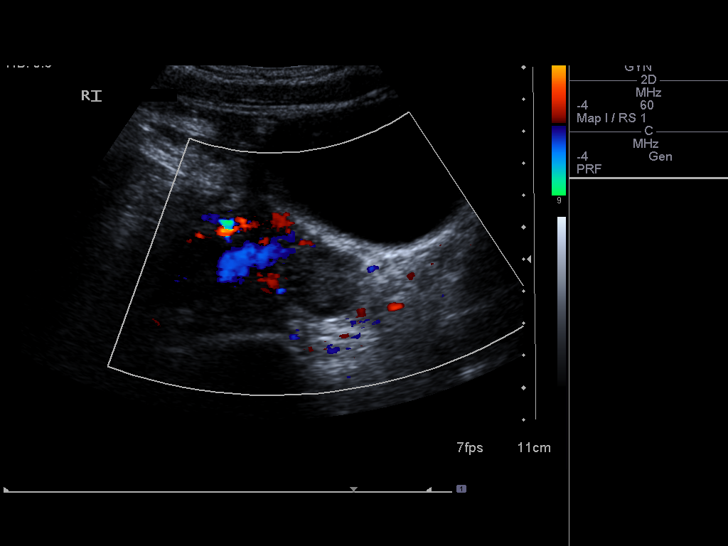
[im 24/52]
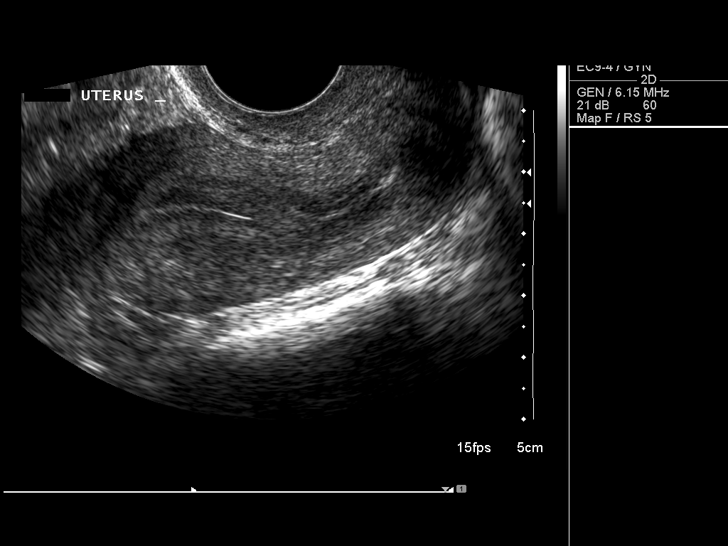
[im 28/52]
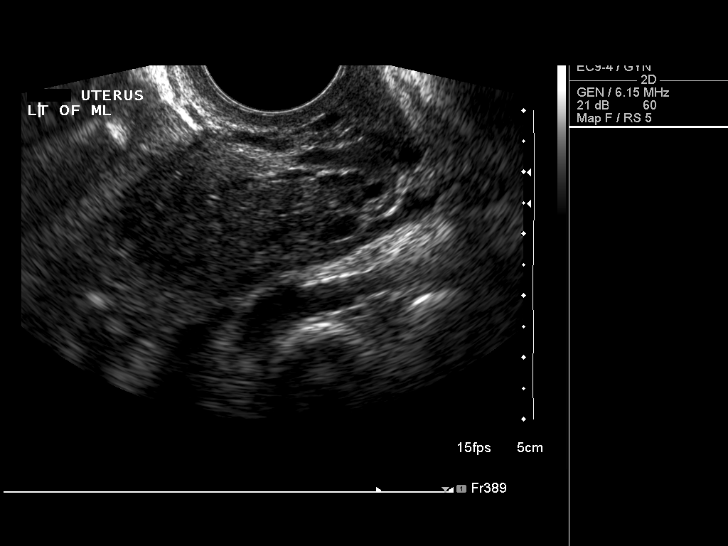
[im 32/52]
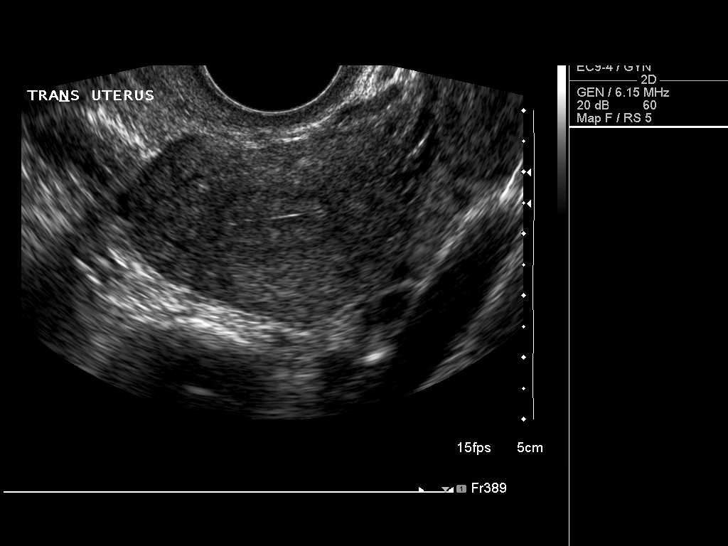
[im 35/52]
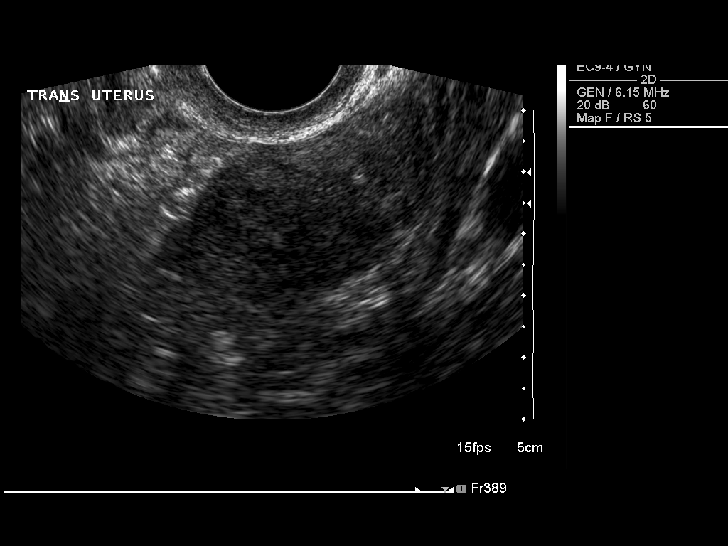
[im 39/52]
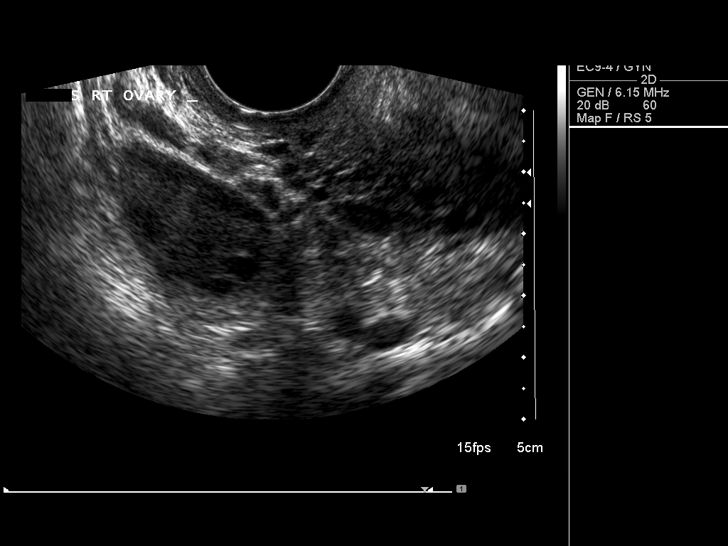
[im 43/52]
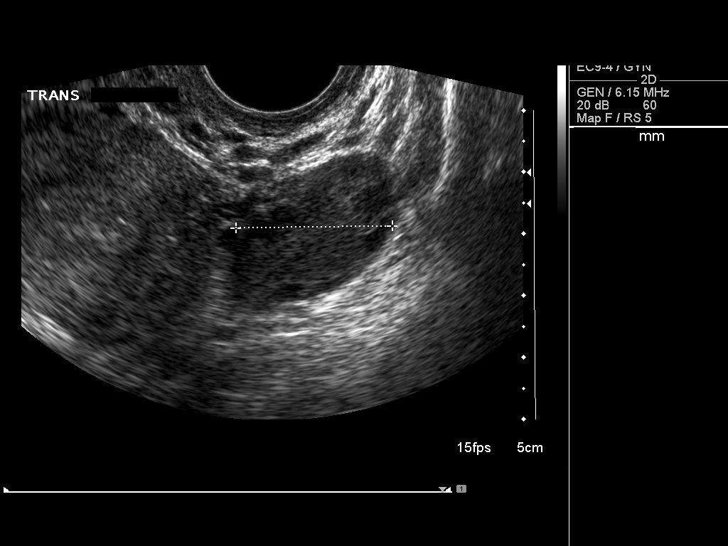
[im 47/52]
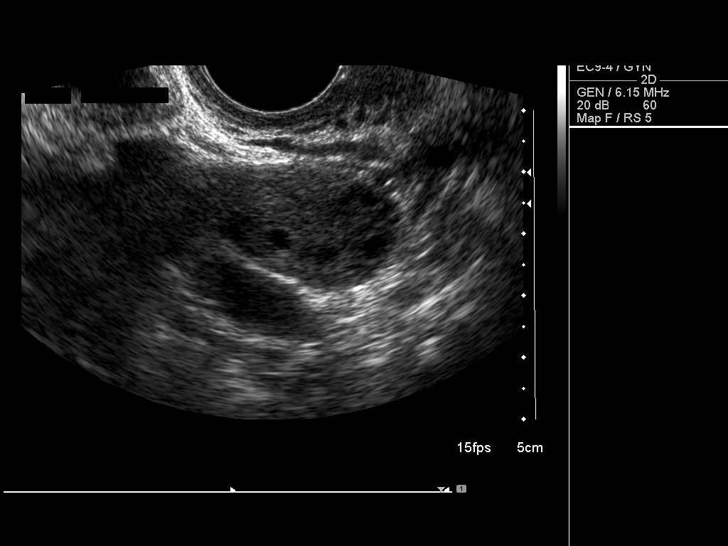
[im 52/52]
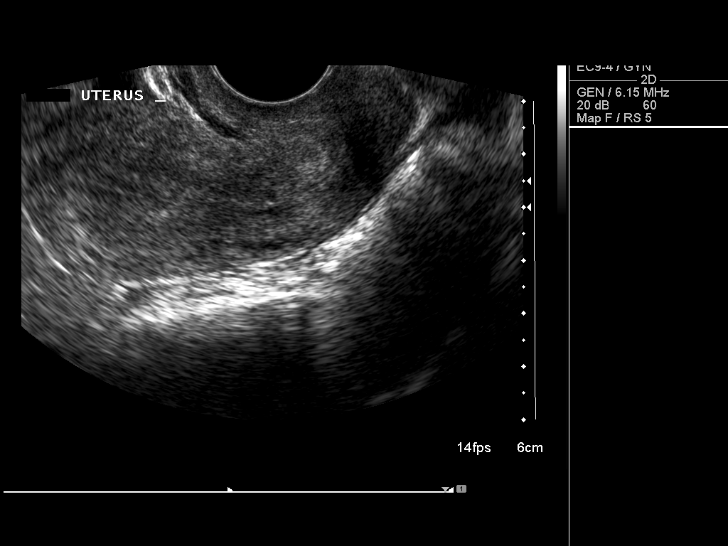

[14 of 25 positions shown; findings below may reference images not displayed]

FINDINGS: Uterus

Measurements: 8.5 x 3.0 x 5 cm. No fibroids or other mass
visualized.

Endometrium

Thickness: 11.2 mm.  No focal abnormality visualized.

Right ovary

Measurements: 3.1 x 2.2 x 2.9 cm. Normal appearance/no adnexal mass.

Left ovary

Measurements: 3.3 x 2.0 x 2.5 cm. Normal appearance/no adnexal mass.

Other findings

There is no free pelvic fluid.
IMPRESSION: Normal pelvic ultrasound. No ovarian cystic structures are observed.

## 2016-04-03 ENCOUNTER — Ambulatory Visit (HOSPITAL_COMMUNITY)
Admission: EM | Admit: 2016-04-03 | Discharge: 2016-04-03 | Disposition: A | Discharge status: Home / Self Care | Payer: Managed Care, Other (non HMO) | Attending: Emergency Medicine | Admitting: Emergency Medicine

## 2016-04-03 ENCOUNTER — Encounter (HOSPITAL_COMMUNITY): Payer: Self-pay | Admitting: Emergency Medicine

## 2016-04-03 DIAGNOSIS — Z3201 Encounter for pregnancy test, result positive: Secondary | ICD-10-CM | POA: Diagnosis not present

## 2016-04-03 DIAGNOSIS — Z3200 Encounter for pregnancy test, result unknown: Secondary | ICD-10-CM

## 2016-04-03 DIAGNOSIS — Z3490 Encounter for supervision of normal pregnancy, unspecified, unspecified trimester: Secondary | ICD-10-CM

## 2016-04-03 LAB — POCT URINALYSIS DIP (DEVICE)
Bilirubin Urine: NEGATIVE
Glucose, UA: NEGATIVE mg/dL
Hgb urine dipstick: NEGATIVE
KETONES UR: NEGATIVE mg/dL
Nitrite: NEGATIVE
PH: 6.5 (ref 5.0–8.0)
PROTEIN: NEGATIVE mg/dL
SPECIFIC GRAVITY, URINE: 1.025 (ref 1.005–1.030)
Urobilinogen, UA: 1 mg/dL (ref 0.0–1.0)

## 2016-04-03 LAB — POCT PREGNANCY, URINE: PREG TEST UR: POSITIVE — AB

## 2016-04-03 MED ORDER — PRENATAL COMPLETE 14-0.4 MG PO TABS: 1.0000 | ORAL_TABLET | Freq: Every day | ORAL | 0 refills | Status: DC

## 2016-04-03 NOTE — Discharge Instructions (Signed)
°  It is very important to talk to your primary care provider for further testing and ongoing care of current pregnancy.   Please also establish care with an OB/GYN.

## 2016-04-03 NOTE — ED Provider Notes (Signed)
CSN: [id number]     Arrival date & time 04/03/16  1442 History   First MD Initiated Contact with Patient 04/03/16 1533     Chief Complaint  Patient presents with  . Possible Pregnancy   (Consider location/radiation/quality/duration/timing/severity/associated sxs/prior Treatment) HPI  Monica Armstrong is a 25 y.o. female presenting to UC with request for a pregnancy test as her menstrual cycle is over 1 month late and she had a possible positive home pregnancy test stating there was a second line but it was very faint.  Pt denies abdominal pain, n/v/d. Denies vaginal bleeding or discharge. Denies urinary symptoms. She reports to having unprotected sex.  She is not on birth control at this time as she states she had been on 3 different kinds, each made her cycle irregular so she would constantly be doing home pregnancy tests.  She does have a PCP but has not been to see them yet.    History reviewed. No pertinent past medical history. History reviewed. No pertinent surgical history. Family History  Problem Relation Age of Onset  . Heart disease Paternal Grandmother    Social History  Substance Use Topics  . Smoking status: Never Smoker  . Smokeless tobacco: Never Used  . Alcohol use No   OB History    Gravida Para Term Preterm AB Living   1             SAB TAB Ectopic Multiple Live Births                 Review of Systems  Constitutional: Negative for chills and fever.  Gastrointestinal: Negative for abdominal pain, diarrhea, nausea and vomiting.  Genitourinary: Positive for menstrual problem ( late). Negative for decreased urine volume, dysuria, flank pain, frequency, pelvic pain, urgency, vaginal bleeding, vaginal discharge and vaginal pain.  Musculoskeletal: Negative for arthralgias and myalgias.    Allergies  Review of patient's allergies indicates no known allergies.  Home Medications   Prior to Admission medications   Medication Sig Start Date End Date Taking?  Authorizing Provider  oxyCODONE-acetaminophen (PERCOCET) 5-325 MG tablet Take 1 tablet by mouth every 4 (four) hours as needed for moderate pain. 12/22/15   Dione Booze, MD  Prenatal Vit-Fe Fumarate-FA (PRENATAL COMPLETE) 14-0.4 MG TABS Take 1 tablet by mouth daily. 04/03/16   Junius Finner, PA-C   Meds Ordered and Administered this Visit  Medications - No data to display  BP 103/70 (BP Location: Left Arm)   Pulse 74   Temp 98.5 F (36.9 C) (Oral)   Resp 16   LMP 02/23/2016   SpO2 100%  No data found.   Physical Exam  Constitutional: She appears well-developed and well-nourished. No distress.  Pt sitting on exam bed, NAD  HENT:  Head: Normocephalic and atraumatic.  Mouth/Throat: Oropharynx is clear and moist.  Eyes: Conjunctivae are normal. No scleral icterus.  Neck: Normal range of motion.  Cardiovascular: Normal rate, regular rhythm and normal heart sounds.   Pulmonary/Chest: Effort normal and breath sounds normal. No respiratory distress. She has no wheezes. She has no rales. She exhibits no tenderness.  Abdominal: Soft. Bowel sounds are normal. She exhibits no distension and no mass. There is no tenderness. There is no rebound and no guarding.  Musculoskeletal: Normal range of motion.  Neurological: She is alert.  Skin: Skin is warm and dry. She is not diaphoretic.  Nursing note and vitals reviewed.   Urgent Care Course   Clinical Course    Procedures (including critical  care time)  Labs Review Labs Reviewed  POCT URINALYSIS DIP (DEVICE) - Abnormal; Notable for the following:       Result Value   Leukocytes, UA SMALL (*)    All other components within normal limits  POCT PREGNANCY, URINE - Abnormal; Notable for the following:    Preg Test, Ur POSITIVE (*)    All other components within normal limits    Imaging Review No results found.  MDM   1. Encounter for pregnancy test   2. Pregnancy    Pt requesting pregnancy test. Urine preg: POSITIVE Denies  abdominal pain or GI symptoms. No indication for imaging or further testing in urgent care setting.   Discussed working along side her PCP to help establish care with an OB/GYN for prenatal care. Will discharge pt home with prenatal vitamins. Pt info provided.  [facility] provided.    Junius Finnerrin O'Malley, PA-C 04/03/16 1701

## 2016-04-03 NOTE — ED Triage Notes (Signed)
[facility]

## 2016-04-04 ENCOUNTER — Ambulatory Visit: Payer: BLUE CROSS/BLUE SHIELD

## 2016-07-28 ENCOUNTER — Ambulatory Visit: Payer: BLUE CROSS/BLUE SHIELD

## 2016-07-29 ENCOUNTER — Emergency Department (HOSPITAL_COMMUNITY)
Admission: EM | Admit: 2016-07-29 | Discharge: 2016-07-29 | Disposition: A | Discharge status: Home / Self Care | Payer: Managed Care, Other (non HMO) | Attending: Emergency Medicine | Admitting: Emergency Medicine

## 2016-07-29 ENCOUNTER — Encounter (HOSPITAL_COMMUNITY): Payer: Self-pay | Admitting: Emergency Medicine

## 2016-07-29 DIAGNOSIS — B9689 Other specified bacterial agents as the cause of diseases classified elsewhere: Secondary | ICD-10-CM | POA: Insufficient documentation

## 2016-07-29 DIAGNOSIS — N760 Acute vaginitis: Secondary | ICD-10-CM | POA: Diagnosis not present

## 2016-07-29 DIAGNOSIS — R1030 Lower abdominal pain, unspecified: Secondary | ICD-10-CM

## 2016-07-29 LAB — URINALYSIS, ROUTINE W REFLEX MICROSCOPIC
Bilirubin Urine: NEGATIVE
GLUCOSE, UA: NEGATIVE mg/dL
KETONES UR: 20 mg/dL — AB
NITRITE: NEGATIVE
PH: 6 (ref 5.0–8.0)
PROTEIN: NEGATIVE mg/dL
Specific Gravity, Urine: 1.019 (ref 1.005–1.030)

## 2016-07-29 LAB — COMPREHENSIVE METABOLIC PANEL
ALBUMIN: 4.1 g/dL (ref 3.5–5.0)
ALK PHOS: 68 U/L (ref 38–126)
ALT: 13 U/L — AB (ref 14–54)
AST: 19 U/L (ref 15–41)
Anion gap: 6 (ref 5–15)
BILIRUBIN TOTAL: 0.8 mg/dL (ref 0.3–1.2)
BUN: 8 mg/dL (ref 6–20)
CALCIUM: 9.3 mg/dL (ref 8.9–10.3)
CO2: 25 mmol/L (ref 22–32)
CREATININE: 0.65 mg/dL (ref 0.44–1.00)
Chloride: 109 mmol/L (ref 101–111)
GFR calc Af Amer: >60 mL/min (ref >60)
GLUCOSE: 82 mg/dL (ref 65–99)
POTASSIUM: 3.8 mmol/L (ref 3.5–5.1)
Sodium: 140 mmol/L (ref 135–145)
TOTAL PROTEIN: 8 g/dL (ref 6.5–8.1)

## 2016-07-29 LAB — CBC
HEMATOCRIT: 38.7 % (ref 36.0–46.0)
Hemoglobin: 13 g/dL (ref 12.0–15.0)
MCH: 28.5 pg (ref 26.0–34.0)
MCHC: 33.6 g/dL (ref 30.0–36.0)
MCV: 84.9 fL (ref 78.0–100.0)
PLATELETS: 273 10*3/uL (ref 150–400)
RBC: 4.56 MIL/uL (ref 3.87–5.11)
RDW: 13.9 % (ref 11.5–15.5)
WBC: 5.1 10*3/uL (ref 4.0–10.5)

## 2016-07-29 LAB — WET PREP, GENITAL
Sperm: NONE SEEN
TRICH WET PREP: NONE SEEN
YEAST WET PREP: NONE SEEN

## 2016-07-29 LAB — LIPASE, BLOOD: Lipase: 30 U/L (ref 11–51)

## 2016-07-29 LAB — I-STAT BETA HCG BLOOD, ED (MC, WL, AP ONLY): I-stat hCG, quantitative: <5 m[IU]/mL (ref <5)

## 2016-07-29 MED ORDER — IBUPROFEN 200 MG PO TABS
600.0000 mg | ORAL_TABLET | Freq: Once | ORAL | Status: AC
  Administered 2016-07-29: 600 mg via ORAL
  Filled 2016-07-29: qty 3

## 2016-07-29 MED ORDER — SODIUM CHLORIDE 0.9 % IV BOLUS (SEPSIS): 1000.0000 mL | Freq: Once | INTRAVENOUS | Status: DC

## 2016-07-29 MED ORDER — NAPROXEN 500 MG PO TABS: 500.0000 mg | ORAL_TABLET | Freq: Two times a day (BID) | ORAL | 0 refills | Status: DC

## 2016-07-29 MED ORDER — METRONIDAZOLE 500 MG PO TABS: 500.0000 mg | ORAL_TABLET | Freq: Two times a day (BID) | ORAL | 0 refills | Status: DC

## 2016-07-29 NOTE — ED Triage Notes (Signed)
Pt reports left upper arm , implant site. Applied 2nd week of September . Also reports emesis and abdominal pain x 2 weeks.  denies urinary symptoms.

## 2016-07-29 NOTE — ED Provider Notes (Signed)
[facility]-EMERGENCY DEPT Provider Note   CSN: [id number] Arrival date & time: 07/29/16  1202     History   Chief Complaint Chief Complaint  Patient presents with  . Abdominal Pain  . Emesis  . implant site pain    HPI Monica Armstrong is a 25 y.o. female.  HPI   Patient is a 25 year old female with no pertinent past medical history presents the ED with multiple complaints. Patient reports having intermittent pain to her mid and lower abdomen for the past week. Denies any aggravating or relieving factors. She reports over the past 3 days she began having sharp stabbing intermittent pain to her right pelvic region. Patient reports pain is worse when going from sitting to standing or bending. She reports starting her new job at the [employer] a few weeks ago and notes she is on her feet most of the day which worsens her pain. Endorses associated nausea. She also reports having intermittent vaginal discharge. Denies history of similar abdominal pain in the past. Denies fever, chills, chest pain, shortness of breath, vomiting, diarrhea, constipation, urinary symptoms, blood in urine or stool, flank pain, vaginal bleeding. Patient reports she is sexually active with one partner and endorses using condoms. She reports her last menstrual period was before she received her Implanon on 02/22/16. Reports having a D&C performed in September. Denies history of any other abdominal surgeries.  Patient also reports having intermittent pain to her left upper arm. She reports pain has been present for the past month and notes she received an Implanon in her left arm in September. Patient denies any aggravating or alleviating factors. Denies redness, swelling, warmth, fever, numbness, tingling, weakness. Patient reports she received her Implanon at the same clinic she had her D&C performed and denies having any follow-up with gynecology. Denies any recent fall, trauma or injury.  History reviewed. No pertinent  past medical history.  There are no active problems to display for this patient.   History reviewed. No pertinent surgical history.  OB History    Gravida Para Term Preterm AB Living   1             SAB TAB Ectopic Multiple Live Births                   Home Medications    Prior to Admission medications   Medication Sig Start Date End Date Taking? Authorizing Provider  diphenhydrAMINE (BENADRYL) 25 mg capsule Take 50 mg by mouth every 6 (six) hours as needed for allergies.    Yes Historical Provider, MD  DM-Doxylamine-Acetaminophen (VICKS NYQUIL COLD & FLU) 15-6.25-325 MG CAPS Take 1 capsule by mouth at bedtime as needed (for sleep).   Yes Historical Provider, MD  etonogestrel (IMPLANON) 68 MG IMPL implant 1 each by Subdermal route once. Placed 04/24/2016   Yes Historical Provider, MD  metroNIDAZOLE (FLAGYL) 500 MG tablet Take 1 tablet (500 mg total) by mouth 2 (two) times daily. 07/29/16   Barrett Henle, PA-C  naproxen (NAPROSYN) 500 MG tablet Take 1 tablet (500 mg total) by mouth 2 (two) times daily. 07/29/16   Barrett Henle, PA-C  Prenatal Vit-Fe Fumarate-FA (PRENATAL COMPLETE) 14-0.4 MG TABS Take 1 tablet by mouth daily. Patient not taking: Reported on 07/29/2016 04/03/16   Junius Finner, PA-C    Family History Family History  Problem Relation Age of Onset  . Heart disease Paternal Grandmother     Social History Social History  Substance Use Topics  . Smoking  status: Never Smoker  . Smokeless tobacco: Never Used  . Alcohol use No     Allergies   Patient has no known allergies.   Review of Systems Review of Systems  Gastrointestinal: Positive for abdominal pain and nausea.  Genitourinary: Positive for vaginal discharge.  Musculoskeletal: Positive for myalgias (left upper arm).  All other systems reviewed and are negative.    Physical Exam Updated Vital Signs BP 110/78 (BP Location: Left Arm)   Pulse 73   Temp 98.1 F (36.7 C)   Resp  15   LMP 04/25/2016   SpO2 100%   Physical Exam  Constitutional: She is oriented to person, place, and time. She appears well-developed and well-nourished. No distress.  HENT:  Head: Normocephalic and atraumatic.  Mouth/Throat: Uvula is midline, oropharynx is clear and moist and mucous membranes are normal. No oropharyngeal exudate, posterior oropharyngeal edema, posterior oropharyngeal erythema or tonsillar abscesses. No tonsillar exudate.  Eyes: Conjunctivae and EOM are normal. Right eye exhibits no discharge. Left eye exhibits no discharge. No scleral icterus.  Neck: Normal range of motion. Neck supple.  Cardiovascular: Normal rate, regular rhythm, normal heart sounds and intact distal pulses.   Pulmonary/Chest: Effort normal and breath sounds normal. No respiratory distress. She has no wheezes. She has no rales. She exhibits no tenderness.  Abdominal: Soft. Bowel sounds are normal. She exhibits no distension and no mass. There is tenderness (mild epigastric and periumbilical region). There is no rigidity, no rebound, no guarding, no CVA tenderness, no tenderness at McBurney's point and negative Murphy's sign. No hernia.  No CVA tenderness  Musculoskeletal: Normal range of motion. She exhibits no edema, tenderness or deformity.       Left shoulder: Normal.       Left elbow: Normal.       Left wrist: Normal.       Left upper arm: She exhibits no tenderness, no bony tenderness, no swelling, no edema, no deformity and no laceration.       Left forearm: Normal.       Left hand: Normal.  Palpable Implanon noted to medial aspect of left upper arm, nontender. No associated swelling, erythema, warmth, induration, fluctuance or drainage. Full range of motion of left upper extremity with 5 out of 5 strength. Sensation grossly intact. 2+ radial pulse. Cap refill less than 2.  Neurological: She is alert and oriented to person, place, and time.  Skin: Skin is warm and dry. She is not diaphoretic.    Nursing note and vitals reviewed.  Pelvic exam: normal external genitalia, vulva, vagina, cervix, uterus and adnexa, VULVA: normal appearing vulva with no masses, tenderness or lesions, VAGINA: normal appearing vagina with normal color and discharge, no lesions, vaginal discharge - bloody, malodorous and mucoid, CERVIX: normal appearing cervix without discharge or lesions, WET MOUNT done - results: clue cells, white blood cells, DNA probe for chlamydia and GC obtained, UTERUS: uterus is normal size, shape, consistency, mild TTP, ADNEXA: normal adnexa in size, nontender and no masses, exam chaperoned by female nurse.   ED Treatments / Results  Labs (all labs ordered are listed, but only abnormal results are displayed) Labs Reviewed  WET PREP, GENITAL - Abnormal; Notable for the following:       Result Value   Clue Cells Wet Prep HPF POC PRESENT (*)    WBC, Wet Prep HPF POC MANY (*)    All other components within normal limits  COMPREHENSIVE METABOLIC PANEL - Abnormal; Notable for the following:  ALT 13 (*)    All other components within normal limits  URINALYSIS, ROUTINE W REFLEX MICROSCOPIC - Abnormal; Notable for the following:    APPearance CLOUDY (*)    Hgb urine dipstick MODERATE (*)    Ketones, ur 20 (*)    Leukocytes, UA LARGE (*)    Bacteria, UA RARE (*)    Squamous Epithelial / LPF 6-30 (*)    All other components within normal limits  URINE CULTURE  LIPASE, BLOOD  CBC  RPR  HIV ANTIBODY (ROUTINE TESTING)  I-STAT BETA HCG BLOOD, ED ([facility], [facility], [facility] ONLY)  GC/CHLAMYDIA PROBE AMP ([place]) NOT AT [facility]    EKG  EKG Interpretation None       Radiology No results found.  Procedures Procedures (including critical care time)  Medications Ordered in ED Medications  sodium chloride 0.9 % bolus 1,000 mL (0 mLs Intravenous Hold 07/29/16 1256)  ibuprofen (ADVIL,MOTRIN) tablet 600 mg (600 mg Oral Given 07/29/16 1355)     Initial Impression / Assessment and Plan / ED  Course  I have reviewed the triage vital signs and the nursing notes.  Pertinent labs & imaging results that were available during my care of the patient were reviewed by me and considered in my medical decision making (see chart for details).  Clinical Course     Patient presents with intermittent lower abdominal pain with associated vaginal discharge and nausea. Denies fever, vomiting, urinary symptoms, vaginal bleeding. VSS. Exam revealed mild tenderness over epigastric and periumbilical region, no peritoneal signs.  Pelvic exam revealed small amount of bloody mucoid malodorous discharge in vaginal vault, no CMT or adnexal tenderness. Remaining exam unremarkable. Wet prep positive for clue cells and WBCs. Pregnancy negative. UA appears to be consistent with contamination, however moderate amount of hgb and 0-5 RBCs noted, patient denies urinary symptoms, do not suspect UTI. Remaining labs unremarkable. Labs for GC/chlamydia, HIV and syphilis pending. Discussed results and pending labs with patient. Suspect patient's symptoms are likely due to menstrual cycle. Patient reports she has had irregular menstrual cycle since having Implanon placed in September. She reports having mild intermittent pain around her left arm where the Implanon was placed. No signs of infection noted on exam. Left upper extremity neurovascularly intact. Patient tolerating by mouth in the ED.  Patient is nontoxic, nonseptic appearing, in no apparent distress.  Patient's pain and other symptoms adequately managed in emergency department.  On repeat exam patient does not have a surgical abdomin and there are no peritoneal signs.  No indication of appendicitis, bowel obstruction, bowel perforation, cholecystitis, diverticulitis, PID or ectopic pregnancy.  Patient discharged home with symptomatic treatment and given strict instructions for follow-up with gynecologist for further management. Also advised patient to follow up regarding  her Implanon.  I have also discussed reasons to return immediately to the ER.  Patient expresses understanding and agrees with plan.     Final Clinical Impressions(s) / ED Diagnoses   Final diagnoses:  Lower abdominal pain  BV (bacterial vaginosis)    New Prescriptions Discharge Medication List as of 07/29/2016  2:42 PM    START taking these medications   Details  metroNIDAZOLE (FLAGYL) 500 MG tablet Take 1 tablet (500 mg total) by mouth 2 (two) times daily., Starting Tue 07/29/2016, Print    naproxen (NAPROSYN) 500 MG tablet Take 1 tablet (500 mg total) by mouth 2 (two) times daily., Starting Tue 07/29/2016, Print         Barrett HenleNicole Elizabeth Amalie Koran, PA-C 07/29/16  1455    Geoffery Lyons, MD 07/29/16 [phone]

## 2016-07-29 NOTE — ED Notes (Signed)
Stacy Rn attempt IV access without any success.  Jeanella CaraMade Nicole PA aware. Verbal ok, to do PO fluids instead of IV fluids.

## 2016-07-29 NOTE — Discharge Instructions (Signed)
Take your medication as prescribed.  I recommend following up with the women's clinic listed below for further management and discussing removal of your Implanon.  Please return to the Emergency Department if symptoms worsen or new onset of fever, chest pain, difficulty breathing, new/worsening abdominal pain, vomiting, urinary symptoms, pelvic pain.

## 2016-07-30 LAB — GC/CHLAMYDIA PROBE AMP (~~LOC~~) NOT AT ARMC
Chlamydia: NEGATIVE
Neisseria Gonorrhea: NEGATIVE

## 2016-07-30 LAB — HIV ANTIBODY (ROUTINE TESTING W REFLEX): HIV Screen 4th Generation wRfx: NONREACTIVE

## 2016-07-30 LAB — RPR: RPR Ser Ql: NONREACTIVE

## 2016-07-31 LAB — URINE CULTURE

## 2016-08-01 ENCOUNTER — Telehealth: Payer: Self-pay

## 2016-08-01 ENCOUNTER — Ambulatory Visit: Payer: BLUE CROSS/BLUE SHIELD

## 2016-08-01 NOTE — Progress Notes (Signed)
ED Antimicrobial Stewardship Positive Culture Follow Up   Monica Armstrong is an 25 y.o. female who [facility] with a chief complaint of  Chief Complaint  Patient presents with  . Abdominal Pain  . Emesis  . implant site pain    Recent Results (from the past 720 hour(s))  Urine culture     Status: Abnormal   Collection Time: 07/29/16 12:41 PM  Result Value Ref Range Status   Specimen Description URINE, RANDOM  Final   Special Requests NONE  Final   Culture >=100,000 COLONIES/mL PROTEUS MIRABILIS (A)  Final   Report Status 07/31/2016 FINAL  Final   Organism ID, Bacteria PROTEUS MIRABILIS (A)  Final      Susceptibility   Proteus mirabilis - MIC*    AMPICILLIN <=2 SENSITIVE Sensitive     CEFAZOLIN <=4 SENSITIVE Sensitive     CEFTRIAXONE <=1 SENSITIVE Sensitive     CIPROFLOXACIN <=0.25 SENSITIVE Sensitive     GENTAMICIN <=1 SENSITIVE Sensitive     IMIPENEM 8 INTERMEDIATE Intermediate     NITROFURANTOIN 128 RESISTANT Resistant     TRIMETH/SULFA <=20 SENSITIVE Sensitive     AMPICILLIN/SULBACTAM <=2 SENSITIVE Sensitive     PIP/TAZO <=4 SENSITIVE Sensitive     * >=100,000 COLONIES/mL PROTEUS MIRABILIS  Wet prep, genital     Status: Abnormal   Collection Time: 07/29/16  1:00 PM  Result Value Ref Range Status   Yeast Wet Prep HPF POC NONE SEEN NONE SEEN Final   Trich, Wet Prep NONE SEEN NONE SEEN Final   Clue Cells Wet Prep HPF POC PRESENT (A) NONE SEEN Final   WBC, Wet Prep HPF POC MANY (A) NONE SEEN Final   Sperm NONE SEEN  Final   If no improvement add amoxicillin 500 mg bid x 1 week  ED Provider: Fayrene HelperBowie Tran, PA-C  Bertram MillardMichael A [name] 08/01/2016, 8:11 AM Infectious Diseases Pharmacist Phone# [phone]

## 2016-08-01 NOTE — Telephone Encounter (Signed)
Returned call from VM Re: UC.  No further nausea of Abd pain. No need for Abx treatment. C/o arm pain, encouraged to return or f/u with PCP

## 2016-08-01 NOTE — Telephone Encounter (Signed)
Post ED Visit - Positive Culture Follow-up: Unsuccessful Patient Follow-up  Culture assessed and recommendations reviewed by: []  Enzo BiNathan Batchelder, Pharm.D. []  Celedonio MiyamotoJeremy Frens, Pharm.D., BCPS [x]  Garvin FilaMike Maccia, Pharm.D. []  Georgina PillionElizabeth Martin, Pharm.D., BCPS []  [place] AcresMinh Pham, [place]arm[place]D., BCPS, AAHIVP []  Estella HuskMichelle Turner, Pharm.D., BCPS, AAHIVP []  Tennis Mustassie Stewart, Pharm.D. []  Rob Oswaldo DoneVincent, [address]Pharm[address]  Positive urine culture  [x]  Patient discharged without antimicrobial prescription and treatment is now indicated []  Organism is resistant to prescribed ED discharge antimicrobial []  Patient with positive blood cultures Start Amox if not any better  Unable to contact patient after 3 attempts, letter will be sent to address on file  Jerry CarasCullom, Rayni Nemitz Burnett 08/01/2016, 10:50 AM

## 2016-09-22 ENCOUNTER — Encounter (HOSPITAL_COMMUNITY): Payer: Self-pay | Admitting: *Deleted

## 2016-09-22 ENCOUNTER — Emergency Department (HOSPITAL_COMMUNITY)
Admission: EM | Admit: 2016-09-22 | Discharge: 2016-09-22 | Disposition: A | Discharge status: Home / Self Care | Payer: Managed Care, Other (non HMO) | Attending: Emergency Medicine | Admitting: Emergency Medicine

## 2016-09-22 DIAGNOSIS — Z79899 Other long term (current) drug therapy: Secondary | ICD-10-CM | POA: Insufficient documentation

## 2016-09-22 DIAGNOSIS — N949 Unspecified condition associated with female genital organs and menstrual cycle: Secondary | ICD-10-CM | POA: Diagnosis not present

## 2016-09-22 DIAGNOSIS — R1030 Lower abdominal pain, unspecified: Secondary | ICD-10-CM | POA: Diagnosis present

## 2016-09-22 DIAGNOSIS — B9689 Other specified bacterial agents as the cause of diseases classified elsewhere: Secondary | ICD-10-CM | POA: Diagnosis not present

## 2016-09-22 DIAGNOSIS — N730 Acute parametritis and pelvic cellulitis: Secondary | ICD-10-CM

## 2016-09-22 DIAGNOSIS — N760 Acute vaginitis: Secondary | ICD-10-CM | POA: Diagnosis not present

## 2016-09-22 LAB — URINALYSIS, ROUTINE W REFLEX MICROSCOPIC
Bilirubin Urine: NEGATIVE
GLUCOSE, UA: NEGATIVE mg/dL
KETONES UR: NEGATIVE mg/dL
Nitrite: NEGATIVE
PH: 6 (ref 5.0–8.0)
Protein, ur: 30 mg/dL — AB
Specific Gravity, Urine: 1.023 (ref 1.005–1.030)

## 2016-09-22 LAB — COMPREHENSIVE METABOLIC PANEL
ALT: 12 U/L — AB (ref 14–54)
AST: 19 U/L (ref 15–41)
Albumin: 4.4 g/dL (ref 3.5–5.0)
Alkaline Phosphatase: 53 U/L (ref 38–126)
Anion gap: 7 (ref 5–15)
BUN: 8 mg/dL (ref 6–20)
CO2: 27 mmol/L (ref 22–32)
Calcium: 9.5 mg/dL (ref 8.9–10.3)
Chloride: 106 mmol/L (ref 101–111)
Creatinine, Ser: 0.72 mg/dL (ref 0.44–1.00)
Glucose, Bld: 94 mg/dL (ref 65–99)
Potassium: 3.5 mmol/L (ref 3.5–5.1)
SODIUM: 140 mmol/L (ref 135–145)
Total Bilirubin: 0.6 mg/dL (ref 0.3–1.2)
Total Protein: 8.5 g/dL — ABNORMAL HIGH (ref 6.5–8.1)

## 2016-09-22 LAB — CBC
HCT: 40.7 % (ref 36.0–46.0)
Hemoglobin: 14 g/dL (ref 12.0–15.0)
MCH: 29 pg (ref 26.0–34.0)
MCHC: 34.4 g/dL (ref 30.0–36.0)
MCV: 84.4 fL (ref 78.0–100.0)
Platelets: 262 10*3/uL (ref 150–400)
RBC: 4.82 MIL/uL (ref 3.87–5.11)
RDW: 13.8 % (ref 11.5–15.5)
WBC: 6.7 10*3/uL (ref 4.0–10.5)

## 2016-09-22 LAB — WET PREP, GENITAL
Sperm: NONE SEEN
Trich, Wet Prep: NONE SEEN
Yeast Wet Prep HPF POC: NONE SEEN

## 2016-09-22 LAB — RPR: RPR Ser Ql: NONREACTIVE

## 2016-09-22 LAB — LIPASE, BLOOD: Lipase: 28 U/L (ref 11–51)

## 2016-09-22 LAB — I-STAT BETA HCG BLOOD, ED (MC, WL, AP ONLY): I-stat hCG, quantitative: <5 m[IU]/mL (ref <5)

## 2016-09-22 LAB — RAPID HIV SCREEN (HIV 1/2 AB+AG)
HIV 1/2 ANTIBODIES: NONREACTIVE
HIV-1 P24 ANTIGEN - HIV24: NONREACTIVE

## 2016-09-22 MED ORDER — DOXYCYCLINE HYCLATE 100 MG PO CAPS
100.0000 mg | ORAL_CAPSULE | Freq: Two times a day (BID) | ORAL | 0 refills | Status: DC
Start: 2016-09-22 — End: 2017-03-01

## 2016-09-22 MED ORDER — LIDOCAINE HCL 1 % IJ SOLN
INTRAMUSCULAR | Status: AC
  Administered 2016-09-22: 20 mL
  Filled 2016-09-22: qty 20

## 2016-09-22 MED ORDER — METRONIDAZOLE 500 MG PO TABS: 500.0000 mg | ORAL_TABLET | Freq: Two times a day (BID) | ORAL | 0 refills | Status: DC

## 2016-09-22 MED ORDER — AZITHROMYCIN 250 MG PO TABS
1000.0000 mg | ORAL_TABLET | Freq: Once | ORAL | Status: AC
Start: 2016-09-22 — End: 2016-09-22
  Administered 2016-09-22: 1000 mg via ORAL
  Filled 2016-09-22: qty 4

## 2016-09-22 MED ORDER — CEFTRIAXONE SODIUM 250 MG IJ SOLR
250.0000 mg | Freq: Once | INTRAMUSCULAR | Status: AC
  Administered 2016-09-22: 250 mg via INTRAMUSCULAR
  Filled 2016-09-22: qty 250

## 2016-09-22 NOTE — ED Provider Notes (Signed)
[facility]-EMERGENCY DEPT Provider Note   CSN: [id number] Arrival date & time: 09/22/16  0547     History   Chief Complaint Chief Complaint  Patient presents with  . Abdominal Pain  . Dizziness  . Nausea    HPI Monica Armstrong is a 26 y.o. female.  HPI   25 year old female presenting with multiple complaints. Patient report within the past month she has had recurrent lower abdominal pain. She described intermittent sharp achy pain towards the lower abdomen sometimes involving her right hip and back worsening with certain movement. She is currently not in any pain or discomfort. Report feeling lightheadedness upon standing with prolonged standing that has been draining within the past 2 weeks without any significant with soap. Also report having vaginal pain and vaginal discharge for the past 3 days. States occasionally burning sensation after urinating. With increased urinary frequency and also having brownish discharge. Her last menstrual period was a week ago. She denies any specific treatment tried. Remote history of gonorrhea. She denies any recent change in his sexual partner. No comparisons of fever, chills, chest pain, shortness of breath. Endorse occasional nausea and occasional loose stools. No history of diabetes.  History reviewed. No pertinent past medical history.  There are no active problems to display for this patient.   History reviewed. No pertinent surgical history.  OB History    Gravida Para Term Preterm AB Living   1             SAB TAB Ectopic Multiple Live Births                   Home Medications    Prior to Admission medications   Medication Sig Start Date End Date Taking? Authorizing Provider  diphenhydrAMINE (BENADRYL) 25 mg capsule Take 50 mg by mouth every 6 (six) hours as needed for allergies.     Historical Provider, MD  DM-Doxylamine-Acetaminophen (VICKS NYQUIL COLD & FLU) 15-6.25-325 MG CAPS Take 1 capsule by mouth at bedtime as needed (for  sleep).    Historical Provider, MD  etonogestrel (IMPLANON) 68 MG IMPL implant 1 each by Subdermal route once. Placed 04/24/2016    Historical Provider, MD  metroNIDAZOLE (FLAGYL) 500 MG tablet Take 1 tablet (500 mg total) by mouth 2 (two) times daily. 07/29/16   Barrett Henle, PA-C  naproxen (NAPROSYN) 500 MG tablet Take 1 tablet (500 mg total) by mouth 2 (two) times daily. 07/29/16   Barrett Henle, PA-C  Prenatal Vit-Fe Fumarate-FA (PRENATAL COMPLETE) 14-0.4 MG TABS Take 1 tablet by mouth daily. Patient not taking: Reported on 07/29/2016 04/03/16   Junius Finner, PA-C    Family History Family History  Problem Relation Age of Onset  . Heart disease Paternal Grandmother     Social History Social History  Substance Use Topics  . Smoking status: Never Smoker  . Smokeless tobacco: Never Used  . Alcohol use No     Allergies   Patient has no known allergies.   Review of Systems Review of Systems  All other systems reviewed and are negative.    Physical Exam Updated Vital Signs BP 119/71 (BP Location: Right Arm)   Pulse 67   Temp 97.6 F (36.4 C) (Oral)   Resp 18   Ht 4\' 11"  (1.499 m)   Wt 49.4 kg   LMP 04/14/2016   SpO2 100%   BMI 22.02 kg/m   Physical Exam  Constitutional: She appears well-developed and well-nourished. No distress.  HENT:  Head:  Atraumatic.  Eyes: Conjunctivae are normal.  Neck: Neck supple.  Cardiovascular: Normal rate and regular rhythm.   Pulmonary/Chest: Effort normal and breath sounds normal.  Abdominal: Soft. Bowel sounds are normal. She exhibits no distension. There is no tenderness.  Genitourinary:  Genitourinary Comments: Chaperone present during pelvic exam. Normal external genitalia. No inguinal lymphadenopathy or inguinal hernia noted. No obvious rash. Pain with speculum insertion. Moderate amount of brownish vaginal discharge noted in vaginal vault. Closed cervical os without friable tissue. On bimanual examination  patient has both left and right adnexal tenderness with cervical motion tenderness.  Neurological: She is alert.  Skin: No rash noted.  Psychiatric: She has a normal mood and affect.  Nursing note and vitals reviewed.    ED Treatments / Results  Labs (all labs ordered are listed, but only abnormal results are displayed) Labs Reviewed  WET PREP, GENITAL - Abnormal; Notable for the following:       Result Value   Clue Cells Wet Prep HPF POC PRESENT (*)    WBC, Wet Prep HPF POC MANY (*)    All other components within normal limits  COMPREHENSIVE METABOLIC PANEL - Abnormal; Notable for the following:    Total Protein 8.5 (*)    ALT 12 (*)    All other components within normal limits  URINALYSIS, ROUTINE W REFLEX MICROSCOPIC - Abnormal; Notable for the following:    APPearance CLOUDY (*)    Hgb urine dipstick MODERATE (*)    Protein, ur 30 (*)    Leukocytes, UA LARGE (*)    Bacteria, UA FEW (*)    Squamous Epithelial / LPF TOO NUMEROUS TO COUNT (*)    All other components within normal limits  LIPASE, BLOOD  CBC  RAPID HIV SCREEN (HIV 1/2 AB+AG)  RPR  I-STAT BETA HCG BLOOD, ED ([facility], [facility], [facility] ONLY)  GC/CHLAMYDIA PROBE AMP ([place]) NOT AT [facility]    EKG  EKG Interpretation None       Radiology No results found.  Procedures Procedures (including critical care time)  Medications Ordered in ED Medications - No data to display   Initial Impression / Assessment and Plan / ED Course  I have reviewed the triage vital signs and the nursing notes.  Pertinent labs & imaging results that were available during my care of the patient were reviewed by me and considered in my medical decision making (see chart for details).     BP 101/66   Pulse (!) 57   Temp 97.6 F (36.4 C) (Oral)   Resp 16   Ht 4\' 11"  (1.499 m)   Wt 49.4 kg   LMP 04/14/2016   SpO2 100%   BMI 22.02 kg/m    Final Clinical Impressions(s) / ED Diagnoses   Final diagnoses:  PID (acute pelvic  inflammatory disease)  BV (bacterial vaginosis)    New Prescriptions New Prescriptions   DOXYCYCLINE (VIBRAMYCIN) 100 MG CAPSULE    Take 1 capsule (100 mg total) by mouth 2 (two) times daily. One po bid x 7 days   6:41 AM Patient here with dysuria vaginal discharge and lower abdominal pain. She does have moderate amount of pain on pelvic examination concerning for PID. She however does not appear toxic and I have low suspicion for TOA or ovarian torsion. Rocephin and Zithromax given. Workup initiated.  9:20 AM Wet prep shows many clue cells and many WBC. Finding is consistence with patient's presentation. The remainder of her labs are reassuring. She is not  pregnant.her urine can is concern for signs of urinary tract infection as well. Patient will be discharge with doxycycline for the next 10 days. Avoid sexual activities, will be notified if she tested positive for specific STD. Encourage partner to be tested as well. Return precaution discussed. Recommend patient to avoid alcohol use while taking antibiotic.   Fayrene Helper, PA-C 09/22/16 8119    Gilda Crease, MD 09/23/16 [phone]

## 2016-09-22 NOTE — ED Notes (Signed)
Rapid HIV results are non-reactive.

## 2016-09-22 NOTE — ED Triage Notes (Signed)
t present with multiple symptoms, abd pain, nausea, painful urination, all at different times starting 2 weeks ago. Pt states she has had a weight loss too.

## 2016-09-23 LAB — GC/CHLAMYDIA PROBE AMP (~~LOC~~) NOT AT ARMC
CHLAMYDIA, DNA PROBE: NEGATIVE
Neisseria Gonorrhea: NEGATIVE

## 2016-09-28 ENCOUNTER — Encounter: Payer: Self-pay | Admitting: Family Medicine

## 2016-12-24 ENCOUNTER — Emergency Department (HOSPITAL_COMMUNITY): Payer: 59

## 2016-12-24 ENCOUNTER — Encounter (HOSPITAL_COMMUNITY): Payer: Self-pay | Admitting: *Deleted

## 2016-12-24 ENCOUNTER — Emergency Department (HOSPITAL_COMMUNITY)
Admission: EM | Admit: 2016-12-24 | Discharge: 2016-12-24 | Disposition: A | Discharge status: Home / Self Care | Payer: 59 | Attending: Emergency Medicine | Admitting: Emergency Medicine

## 2016-12-24 DIAGNOSIS — Z79899 Other long term (current) drug therapy: Secondary | ICD-10-CM | POA: Diagnosis not present

## 2016-12-24 DIAGNOSIS — J4521 Mild intermittent asthma with (acute) exacerbation: Secondary | ICD-10-CM | POA: Diagnosis not present

## 2016-12-24 DIAGNOSIS — J029 Acute pharyngitis, unspecified: Secondary | ICD-10-CM | POA: Diagnosis present

## 2016-12-24 LAB — I-STAT BETA HCG BLOOD, ED (MC, WL, AP ONLY): I-stat hCG, quantitative: <5 m[IU]/mL (ref <5)

## 2016-12-24 MED ORDER — AEROCHAMBER PLUS FLO-VU MEDIUM MISC
1.0000 | Freq: Once | Status: AC
  Administered 2016-12-24: 1
  Filled 2016-12-24: qty 1

## 2016-12-24 MED ORDER — ALBUTEROL SULFATE HFA 108 (90 BASE) MCG/ACT IN AERS
1.0000 | INHALATION_SPRAY | RESPIRATORY_TRACT | Status: DC | PRN
  Administered 2016-12-24: 2 via RESPIRATORY_TRACT
  Filled 2016-12-24: qty 6.7

## 2016-12-24 NOTE — ED Notes (Signed)
Pt came to [facility] asking how much longer she is having trouble breathing I explained to the pt that it should not be to much longer.

## 2016-12-24 NOTE — ED Notes (Signed)
Declined W/C at D/C and was escorted to lobby by RN. 

## 2016-12-24 NOTE — ED Provider Notes (Signed)
[facility]-EMERGENCY DEPT Provider Note   CSN: [id number] Arrival date & time: 12/24/16  1338  By signing my name below, I, Monica Armstrong, attest that this documentation has been prepared under the direction and in the presence of Jacalyn Lefevre, MD. Electronically Signed: Marnette Burgess Armstrong, Scribe. 12/24/2016. 5:29 PM.  History   Chief Complaint Chief Complaint  Patient presents with  . Sore Throat   The history is provided by the patient and medical records. No language interpreter was used.    HPI Comments:  Monica Armstrong is a 26 y.o. female with no pertinent PMHx, who presents to the Emergency Department complaining of a sore throat onset one month ago. Pt reports this throat pain arises spontaneously and feels like her "throat is swelling." She qualifies the pain as "tightening" and rates the pain 8/10. Pt is tolerating secretions and fluids well. Pt has associated symptoms of intermittent SOB, intermittent pain to her bilateral breasts, wheezing, generalized abdominal pain, and chest tightness. She did not try anything for relief of her pain PTA. Pt denies leg pain, dysuria, and any other complaints at this time. Pt is not a smoker but is around people who do. LMP~ November 20, 2016   History reviewed. No pertinent past medical history.  There are no active problems to display for this patient.  History reviewed. No pertinent surgical history.  OB History    Gravida Para Term Preterm AB Living   1             SAB TAB Ectopic Multiple Live Births                 Home Medications    Prior to Admission medications   Medication Sig Start Date End Date Taking? Authorizing Provider  doxycycline (VIBRAMYCIN) 100 MG capsule Take 1 capsule (100 mg total) by mouth 2 (two) times daily. One po bid x 7 days 09/22/16   Fayrene Helper, PA-C  metroNIDAZOLE (FLAGYL) 500 MG tablet Take 1 tablet (500 mg total) by mouth 2 (two) times daily. 09/22/16   Fayrene Helper, PA-C  norelgestromin-ethinyl  estradiol (ORTHO EVRA) 150-35 MCG/24HR transdermal patch Place 1 patch onto the skin once a week. Apply patch once weekly on Friday for 3 weeks and off for 1 week    [provider]   Family History Family History  Problem Relation Age of Onset  . Heart disease Paternal Grandmother    Social History Social History  Substance Use Topics  . Smoking status: Never Smoker  . Smokeless tobacco: Never Used  . Alcohol use No   Allergies   Patient has no known allergies.   Review of Systems Review of Systems All systems reviewed and are negative for acute change except as noted in the HPI.    Physical Exam Updated Vital Signs BP 117/72 (BP Location: Right Arm)   Pulse 70   Temp 98.6 F (37 C) (Oral)   Resp 19   LMP 10/20/2016   SpO2 100%   Breastfeeding? Unknown   Physical Exam  Constitutional: She is oriented to person, place, and time. She appears well-developed and well-nourished.  HENT:  Head: Normocephalic.  Eyes: Conjunctivae are normal.  Cardiovascular: Normal rate.   Pulmonary/Chest: Effort normal. She has wheezes.  Abdominal: She exhibits no distension.  Musculoskeletal: Normal range of motion.  Neurological: She is alert and oriented to person, place, and time.  Skin: Skin is warm and dry.  Psychiatric: She has a normal mood and affect.  Nursing  note and vitals reviewed.    ED Treatments / Results  DIAGNOSTIC STUDIES:  Oxygen Saturation is 100% on RA, normal by my interpretation.    COORDINATION OF CARE:  5:28 PM Discussed treatment plan with pt at bedside including blood work, CXR, inhaler with spacer and pt agreed to plan.  6:35 PM Pt re-evaluated with discussion on results of imaging.   Labs (all labs ordered are listed, but only abnormal results are displayed) Labs Reviewed  I-STAT BETA HCG BLOOD, ED ([facility], [facility], [facility] ONLY)    EKG  EKG Interpretation None       Radiology Dg Chest 2 View  Result Date: 12/24/2016 CLINICAL DATA:   Shortness of breath EXAM: CHEST  2 VIEW COMPARISON:  04/08/2015 FINDINGS: The heart size and mediastinal contours are within normal limits. Both lungs are clear. The visualized skeletal structures are unremarkable. IMPRESSION: No active cardiopulmonary disease. Electronically Signed   By: Jasmine PangKim  Fujinaga M.D.   On: 12/24/2016 18:27    Procedures Procedures (including critical care time)  Medications Ordered in ED Medications  albuterol (PROVENTIL HFA;VENTOLIN HFA) 108 (90 Base) MCG/ACT inhaler 1-2 puff (2 puffs Inhalation Given 12/24/16 1836)  AEROCHAMBER PLUS FLO-VU MEDIUM MISC 1 each (1 each Other Given 12/24/16 1836)     Initial Impression / Assessment and Plan / ED Course  I have reviewed the triage vital signs and the nursing notes.  Pertinent labs & imaging results that were available during my care of the patient were reviewed by me and considered in my medical decision making (see chart for details).    Pt is feeling better.  She is given an albuterol inhaler plus spacer.  She knows to return if worse.  Final Clinical Impressions(s) / ED Diagnoses   Final diagnoses:  Mild intermittent reactive airway disease with acute exacerbation    New Prescriptions New Prescriptions   No medications on file    I personally performed the services described in this documentation, which was scribed in my presence. The recorded information has been reviewed and is accurate.     Jacalyn LefevreHaviland, Abiel Antrim, MD 12/24/16 1840

## 2016-12-24 NOTE — ED Triage Notes (Signed)
Pt reports a tightening pain to her throat for one month, states it feels like her throat is swelling. Airway intact at triage. Reports occ intermittent pain to her breasts and needs pregnancy test.

## 2017-01-18 ENCOUNTER — Emergency Department (HOSPITAL_COMMUNITY): Payer: 59

## 2017-01-18 ENCOUNTER — Emergency Department (HOSPITAL_COMMUNITY)
Admission: EM | Admit: 2017-01-18 | Discharge: 2017-01-18 | Disposition: A | Discharge status: Home / Self Care | Payer: 59 | Attending: Emergency Medicine | Admitting: Emergency Medicine

## 2017-01-18 ENCOUNTER — Encounter (HOSPITAL_COMMUNITY): Payer: Self-pay

## 2017-01-18 DIAGNOSIS — S92515A Nondisplaced fracture of proximal phalanx of left lesser toe(s), initial encounter for closed fracture: Secondary | ICD-10-CM | POA: Insufficient documentation

## 2017-01-18 DIAGNOSIS — W228XXA Striking against or struck by other objects, initial encounter: Secondary | ICD-10-CM | POA: Diagnosis not present

## 2017-01-18 DIAGNOSIS — Y92832 Beach as the place of occurrence of the external cause: Secondary | ICD-10-CM | POA: Insufficient documentation

## 2017-01-18 DIAGNOSIS — Y998 Other external cause status: Secondary | ICD-10-CM | POA: Insufficient documentation

## 2017-01-18 DIAGNOSIS — Z7983 Long term (current) use of bisphosphonates: Secondary | ICD-10-CM | POA: Insufficient documentation

## 2017-01-18 DIAGNOSIS — Y9359 Activity, other involving other sports and athletics played individually: Secondary | ICD-10-CM | POA: Diagnosis not present

## 2017-01-18 DIAGNOSIS — M25572 Pain in left ankle and joints of left foot: Secondary | ICD-10-CM | POA: Diagnosis present

## 2017-01-18 MED ORDER — IBUPROFEN 800 MG PO TABS
800.0000 mg | ORAL_TABLET | Freq: Once | ORAL | Status: AC
  Administered 2017-01-18: 800 mg via ORAL
  Filled 2017-01-18: qty 1

## 2017-01-18 NOTE — Discharge Instructions (Signed)
Please call Dr. Cyndie Chimeopland's office and schedule a follow-up appointment in 1 week. Please continue to put a piece of gauze between the 2 toes and tape the third and fourth toe together until you're reevaluated. He take ibuprofen, 800 mg, every 8 hours as needed for pain he can apply ice to the area to help with pain and swelling. If you develop new or worsening symptoms, including fever, chills, severe swelling, or red streaks to the area, please return to the emergency department for re-evaluation.

## 2017-01-18 NOTE — ED Provider Notes (Signed)
[facility] Provider Note   CSN: [id number] Arrival date & time: 01/18/17  [id number]     History   Chief Complaint Chief Complaint  Patient presents with  . Foot Pain    HPI Monica Armstrong is a 26 y.o. female who presents to the emergency department with left foot pain. She reports that she was at the beach yesterday with her friends and went to kick a ball and ended up colliding with another person who kicked her in the foot with their foot. She reports that she has been limping since the incident. No right foot pain. No left knee or hip pain. No previous injury to the foot or ankle. No pertinent past medical history. No treatment prior to arrival.  HPI  History reviewed. No pertinent past medical history.  There are no active problems to display for this patient.   History reviewed. No pertinent surgical history.  OB History    Gravida Para Term Preterm AB Living   1             SAB TAB Ectopic Multiple Live Births                   Home Medications    Prior to Admission medications   Medication Sig Start Date End Date Taking? Authorizing Provider  doxycycline (VIBRAMYCIN) 100 MG capsule Take 1 capsule (100 mg total) by mouth 2 (two) times daily. One po bid x 7 days 09/22/16   Fayrene Helperran, [place], PA-C  metroNIDAZOLE (FLAGYL) 500 MG tablet Take 1 tablet (500 mg total) by mouth 2 (two) times daily. 09/22/16   Fayrene Helperran, Bowie, PA-C  norelgestromin-ethinyl estradiol (ORTHO EVRA) 150-35 MCG/24HR transdermal patch Place 1 patch onto the skin once a week. Apply patch once weekly on Friday for 3 weeks and off for 1 week    [provider]    Family History Family History  Problem Relation Age of Onset  . Heart disease Paternal Grandmother     Social History Social History  Substance Use Topics  . Smoking status: Never Smoker  . Smokeless tobacco: Never Used  . Alcohol use No     Allergies   Patient has no known allergies.   Review of Systems Review of  Systems  Constitutional: Negative for activity change.  Respiratory: Negative for shortness of breath.   Cardiovascular: Negative for chest pain.  Gastrointestinal: Negative for abdominal pain.  Musculoskeletal: Positive for arthralgias and myalgias. Negative for back pain.  Skin: Positive for wound. Negative for rash.     Physical Exam Updated Vital Signs BP 121/80 (BP Location: Right Arm)   Pulse 89   Temp 98.1 F (36.7 C) (Oral)   Resp 16   LMP 12/28/2016 (Approximate)   SpO2 100%   Physical Exam  Constitutional: No distress.  HENT:  Head: Normocephalic.  Eyes: Conjunctivae are normal.  Neck: Neck supple.  Cardiovascular: Normal rate and regular rhythm.  Exam reveals no gallop and no friction rub.   No murmur heard. Pulmonary/Chest: Effort normal. No respiratory distress.  Abdominal: Soft. She exhibits no distension.  Musculoskeletal:  To palpation over the base of the left fourth digit. Good strength against resistance. 2+ bilateral DP and PT pulses. Full range of motion of the left ankle. There is a hemostatic superficial abrasion to the left second and fifth digit. NVI.   Neurological: She is alert.  Skin: Skin is warm. No rash noted.  Psychiatric: Her behavior is normal.  Nursing note and vitals reviewed.  ED Treatments / Results  Labs (all labs ordered are listed, but only abnormal results are displayed) Labs Reviewed - No data to display  EKG  EKG Interpretation None       Radiology Dg Foot Complete Left  Result Date: 01/18/2017 CLINICAL DATA:  Pain after kicking solid object EXAM: LEFT FOOT - COMPLETE 3+ VIEW COMPARISON:  None. FINDINGS: Frontal, oblique, and lateral views were obtained. There is an incomplete fracture along the medial aspect of the proximal portion of the fourth proximal phalanx. Alignment anatomic. No other fracture. No dislocation. Joint spaces appear normal. No erosive change. IMPRESSION: Incomplete fracture along the medial aspect of  the proximal portion of the fourth proximal phalanx. Alignment anatomic. No other fracture. No dislocation. No evident arthropathy. Electronically Signed   By: Bretta Bang III M.D.   On: 01/18/2017 09:25    Procedures Procedures (including critical care time)  Medications Ordered in ED Medications  ibuprofen (ADVIL,MOTRIN) tablet 800 mg (800 mg Oral Given 01/18/17 1022)     Initial Impression / Assessment and Plan / ED Course  I have reviewed the triage vital signs and the nursing notes.  Pertinent labs & imaging results that were available during my care of the patient were reviewed by me and considered in my medical decision making (see chart for details).     Patient X-Ray with incomplete fracture along the medial aspect of the proximal portion of the fourth proximal phalanx. Pain managed in ED. Pt advised to follow up with PCP for further evaluation and treatment. Buddy tape splint applied in the ED and crutches given for comfort. Conservative therapy recommended and discussed. Patient will be dc home & is agreeable with above plan. I have also discussed reasons to return immediately to the ER.  Patient expresses understanding and agrees with plan.  Final Clinical Impressions(s) / ED Diagnoses   Final diagnoses:  Closed nondisplaced fracture of proximal phalanx of lesser toe of left foot, initial encounter    New Prescriptions Discharge Medication List as of 01/18/2017 10:38 AM       Barkley Boards, PA-C 01/20/17 1610    Azalia Bilis, MD 01/26/17 1007

## 2017-01-18 NOTE — ED Triage Notes (Signed)
Pt at beach yesterday.  Pt playing in sand and kicking up dirt.  Pt kicked in left foot.  Skin abrasion noted.  Painful to walk and move.

## 2017-01-18 NOTE — ED Notes (Signed)
Bed: WTR7 Expected date:  Expected time:  Means of arrival:  Comments: 

## 2017-01-19 ENCOUNTER — Emergency Department (HOSPITAL_COMMUNITY)
Admission: EM | Admit: 2017-01-19 | Discharge: 2017-01-19 | Discharge status: Against Medical Advice | Payer: Managed Care, Other (non HMO)

## 2017-01-19 NOTE — ED Notes (Signed)
No answer when called for triage x2 

## 2017-01-19 NOTE — ED Notes (Signed)
No answer when called for triage 

## 2017-01-20 ENCOUNTER — Emergency Department (HOSPITAL_COMMUNITY)
Admission: EM | Admit: 2017-01-20 | Discharge: 2017-01-20 | Disposition: A | Discharge status: Home / Self Care | Payer: 59 | Attending: Emergency Medicine | Admitting: Emergency Medicine

## 2017-01-20 ENCOUNTER — Encounter (HOSPITAL_COMMUNITY): Payer: Self-pay | Admitting: Emergency Medicine

## 2017-01-20 DIAGNOSIS — S80861A Insect bite (nonvenomous), right lower leg, initial encounter: Secondary | ICD-10-CM | POA: Diagnosis not present

## 2017-01-20 DIAGNOSIS — Y929 Unspecified place or not applicable: Secondary | ICD-10-CM | POA: Insufficient documentation

## 2017-01-20 DIAGNOSIS — S80862A Insect bite (nonvenomous), left lower leg, initial encounter: Secondary | ICD-10-CM | POA: Diagnosis not present

## 2017-01-20 DIAGNOSIS — W57XXXA Bitten or stung by nonvenomous insect and other nonvenomous arthropods, initial encounter: Secondary | ICD-10-CM | POA: Insufficient documentation

## 2017-01-20 DIAGNOSIS — Y999 Unspecified external cause status: Secondary | ICD-10-CM | POA: Diagnosis not present

## 2017-01-20 DIAGNOSIS — Y939 Activity, unspecified: Secondary | ICD-10-CM | POA: Diagnosis not present

## 2017-01-20 DIAGNOSIS — Z79899 Other long term (current) drug therapy: Secondary | ICD-10-CM | POA: Insufficient documentation

## 2017-01-20 DIAGNOSIS — R21 Rash and other nonspecific skin eruption: Secondary | ICD-10-CM | POA: Insufficient documentation

## 2017-01-20 MED ORDER — CALAMINE EX LOTN: 1.0000 "application " | TOPICAL_LOTION | CUTANEOUS | 0 refills | Status: DC | PRN

## 2017-01-20 MED ORDER — HYDROXYZINE HCL 10 MG PO TABS: 10.0000 mg | ORAL_TABLET | Freq: Four times a day (QID) | ORAL | 0 refills | Status: DC | PRN

## 2017-01-20 NOTE — ED Triage Notes (Signed)
Patient c/o itching and burning rash to bilateral legs since Monday. Reports one wound is draining clear fluids. Ambulatory to triage.

## 2017-01-20 NOTE — ED Provider Notes (Signed)
[facility]-EMERGENCY DEPT Provider Note   CSN: [id number] Arrival date & time: 01/20/17  1628  By signing my name below, I, Monica Armstrong, attest that this documentation has been prepared under the direction and in the presence of Monica Armstrong, [place]. Electronically Signed: Diona BrownerJennifer Armstrong, ED Scribe. 01/20/17. 5:21 PM.  History   Chief Complaint Chief Complaint  Patient presents with  . Rash    HPI Monica Armstrong is a 26 y.o. female who presents to the Emergency Department complaining of an itching and burning rash to her bilateral legs since yesterday. Pt reports one of the wounds is draining clear fluid. Pt went to the beach Saturday, 01/17/17 and reported to the ED on Sunday 01/18/17 for a toe injury which happened Saturday at the beach. Patient reports yesterday she began to notice a burning sensation to her right shin after she was wearing sweats under her work pants within elastic band over her shin. She reports noticing a small red area with small clear fluid-filled bumps and associated burning sensation. No recent known burns or injuries. Coworker who went to the beach with her has similar sx of insect bites. No new soaps, lotions, detergents, medications or food. Pt denies fever, nausea, vomiting, diarrhea, mouth lesions, and difficulty breathing. Denies taking any medications or applying any lotions for her rash.  The history is provided by the patient. No language interpreter was used.    History reviewed. No pertinent past medical history.  There are no active problems to display for this patient.   History reviewed. No pertinent surgical history.  OB History    Gravida Para Term Preterm AB Living   1             SAB TAB Ectopic Multiple Live Births                   Home Medications    Prior to Admission medications   Medication Sig Start Date End Date Taking? Authorizing Provider  calamine lotion Apply 1 application topically as needed for itching. 01/20/17   Barrett HenleNadeau,  Airica Schwartzkopf Elizabeth, PA-C  doxycycline (VIBRAMYCIN) 100 MG capsule Take 1 capsule (100 mg total) by mouth 2 (two) times daily. One po bid x 7 days 09/22/16   Fayrene Helperran, Bowie, PA-C  hydrOXYzine (ATARAX/VISTARIL) 10 MG tablet Take 1 tablet (10 mg total) by mouth every 6 (six) hours as needed for itching. 01/20/17   Barrett HenleNadeau, Harika Laidlaw Elizabeth, PA-C  metroNIDAZOLE (FLAGYL) 500 MG tablet Take 1 tablet (500 mg total) by mouth 2 (two) times daily. 09/22/16   Fayrene Helperran, Bowie, PA-C  norelgestromin-ethinyl estradiol (ORTHO EVRA) 150-35 MCG/24HR transdermal patch Place 1 patch onto the skin once a week. Apply patch once weekly on Friday for 3 weeks and off for 1 week    [provider]    Family History Family History  Problem Relation Age of Onset  . Heart disease Paternal Grandmother     Social History Social History  Substance Use Topics  . Smoking status: Never Smoker  . Smokeless tobacco: Never Used  . Alcohol use No     Allergies   Patient has no known allergies.   Review of Systems Review of Systems  Constitutional: Negative for fever.  Respiratory: Negative for shortness of breath.   Gastrointestinal: Negative for diarrhea, nausea and vomiting.  Skin: Positive for rash.     Physical Exam Updated Vital Signs BP 115/78 (BP Location: Left Arm)   Pulse 100   Temp 98.2 F (36.8 C) (Oral)  Resp 16   LMP 12/28/2016 (Approximate)   SpO2 100%   Physical Exam  Constitutional: She is oriented to person, place, and time. She appears well-developed and well-nourished. No distress.  HENT:  Head: Normocephalic and atraumatic.  Mouth/Throat: Uvula is midline, oropharynx is clear and moist and mucous membranes are normal. No oropharyngeal exudate, posterior oropharyngeal edema, posterior oropharyngeal erythema or tonsillar abscesses. No tonsillar exudate.  No oral leison.  Eyes: Conjunctivae and EOM are normal. Right eye exhibits no discharge. Left eye exhibits no discharge. No scleral  icterus.  Neck: Normal range of motion. Neck supple.  Cardiovascular: Normal rate, regular rhythm, normal heart sounds and intact distal pulses.   Pulmonary/Chest: Effort normal and breath sounds normal. No respiratory distress. She has no wheezes. She has no rales. She exhibits no tenderness.  Abdominal: Soft. There is no tenderness.  Musculoskeletal: Normal range of motion. She exhibits no edema.  Neurological: She is alert and oriented to person, place, and time.  Skin: Skin is warm and dry. Rash noted. She is not diaphoretic.  Multiple small erythematous papule present to bilateral legs without surrounding swelling, warmth, erythema or drainage. Single small healing abrasion present right anterior shin with few small bola present. No surrounding swelling, erythema, warmth induration, fluctuance or drainage. No vesicles or pustules present. No lesions on palms or soles.   Nursing note and vitals reviewed.    ED Treatments / Results  DIAGNOSTIC STUDIES: Oxygen Saturation is 100% on RA, normal by my interpretation.   COORDINATION OF CARE: 5:21 PM-Discussed next steps with pt which includes putting neosporin on the open wound and using Cortizone cream or calamine lotion for itching. It sx worsens, pt is to return to the ED to be reevaluated. Pt verbalized understanding and is agreeable with the plan.   Labs (all labs ordered are listed, but only abnormal results are displayed) Labs Reviewed - No data to display  EKG  EKG Interpretation None       Radiology No results found.  Procedures Procedures (including critical care time)  Medications Ordered in ED Medications - No data to display   Initial Impression / Assessment and Plan / ED Course  I have reviewed the triage vital signs and the nursing notes.  Pertinent labs & imaging results that were available during my care of the patient were reviewed by me and considered in my medical decision making (see chart for  details).     Rash consistent with insect bites to bilateral legs and single small abrasion/superficial first degree burn present to right shin. Patient denies any difficulty breathing or swallowing.  Pt has a patent airway without stridor and is handling secretions without difficulty; no angioedema. No blisters, no pustules, no warmth, no draining sinus tracts, no superficial abscesses, no bullous impetigo, no vesicles, no desquamation, no target lesions with dusky purpura or a central bulla. Not tender to touch. No concern for superimposed infection. No concern for SJS, TEN, TSS, tick borne illness, syphilis or other life-threatening condition. Will discharge home with prescription for calamine lotion and Vistaril as needed for insect bites and recommend Benadryl as needed for pruritis. Also advised patient to apply Neosporin ointment to abrasion/burn once daily. Discussed symptomatic treatment. Advised patient to follow up with PCP in 3-4 days as needed if rash has not improved. Discussed strict return precautions.    Final Clinical Impressions(s) / ED Diagnoses   Final diagnoses:  Rash  Insect bite, initial encounter    New Prescriptions Discharge Medication List  as of 01/20/2017  5:25 PM    START taking these medications   Details  calamine lotion Apply 1 application topically as needed for itching., Starting Tue 01/20/2017, Print    hydrOXYzine (ATARAX/VISTARIL) 10 MG tablet Take 1 tablet (10 mg total) by mouth every 6 (six) hours as needed for itching., Starting Tue 01/20/2017, Print       I personally performed the services described in this documentation, which was scribed in my presence. The recorded information has been reviewed and is accurate.     Barrett Henle, PA-C 01/20/17 1746    Mancel Bale, MD 01/20/17 [phone]

## 2017-01-20 NOTE — Discharge Instructions (Signed)
I recommend applying a small amount of Neosporin ointment to your wound once daily. You may also apply the calamine lotion or over-the-counter hydrocortisone cream to your insect bites as needed for itch relief. You may take your prescription of Vistaril as needed for itching. I also recommend taking Benadryl as prescribed over-the-counter as needed for additional itch relief. Follow-up with your primary care provider within the next 3-4 days if your symptoms have not improved or worsened. Return to the emergency department if symptoms worsen or new onset of fever, oral lesion, difficulty breathing, vomiting, new/worsening rash, drainage, redness, swelling, warmth.

## 2017-01-24 ENCOUNTER — Encounter (HOSPITAL_COMMUNITY): Payer: Self-pay

## 2017-01-24 ENCOUNTER — Ambulatory Visit (HOSPITAL_COMMUNITY)
Admission: EM | Admit: 2017-01-24 | Discharge: 2017-01-24 | Disposition: A | Discharge status: Home / Self Care | Payer: 59 | Attending: Family Medicine | Admitting: Family Medicine

## 2017-01-24 DIAGNOSIS — S92912A Unspecified fracture of left toe(s), initial encounter for closed fracture: Secondary | ICD-10-CM

## 2017-01-24 NOTE — ED Provider Notes (Signed)
[id number]  01/24/17 1313  ASSESSMENT & PLAN:  Final diagnoses:  Unspecified fracture of left toe(s), initial encounter for closed fracture   Placed in post-op shoe. Discussed healing time for toe fracture. Tylenol if needed. May f/u in 2 weeks for recheck.   Reviewed expectations re: course of current medical issues.  Discussed self-management of symptoms.  Outlined signs and symptoms indicating need for more acute intervention.  Patient verbalized understanding. Questions answered.  After Visit Summary given.   SUBJECTIVE:  Monica Armstrong is a 26 y.o. female who presents with [facility] Has been buddy-taping 3rd and 4th toes together. Less discomfort but trouble her at times when walking. No sensation changes. Otherwise well.   OBJECTIVE:  Vitals:   01/24/17 1352  BP: 124/72  Pulse: 84  Resp: 20  Temp: 99 F (37.2 C)  TempSrc: Oral  SpO2: 100%    ROS: As per HPI.  BP 124/72 (BP Location: Right Arm)   Pulse 84   Temp 99 F (37.2 C) (Oral)   Resp 20   LMP 12/28/2016 (Approximate)   SpO2 100%   Breastfeeding? No   No distress. Left 4th toe with mild tenderness to palpation. Minimal swelling. Sensation intact. Normal capillary refill.   No Known Allergies  PMHx, SurgHx, SocialHx, Medications, and Allergies were reviewed in the Visit Navigator and updated as appropriate.   Prior to Admission medications   Medication Sig Start Date End Date Taking? Authorizing Provider  calamine lotion Apply 1 application topically as needed for itching. 01/20/17   Barrett HenleNadeau, Nicole Elizabeth, PA-C  doxycycline (VIBRAMYCIN) 100 MG capsule Take 1 capsule (100 mg total) by mouth 2 (two) times daily. One po bid x 7 days 09/22/16   Fayrene Helperran, [place]ie, PA-C  hydrOXYzine (ATARAX/VISTARIL) 10 MG tablet Take 1 tablet (10 mg total) by mouth every 6 (six) hours as needed for itching. 01/20/17   Barrett HenleNadeau, Nicole Elizabeth, PA-C  metroNIDAZOLE  (FLAGYL) 500 MG tablet Take 1 tablet (500 mg total) by mouth 2 (two) times daily. 09/22/16   Fayrene Helperran, [place]ie, PA-C  norelgestromin-ethinyl estradiol (ORTHO EVRA) 150-35 MCG/24HR transdermal patch Place 1 patch onto the skin once a week. Apply patch once weekly on Friday for 3 weeks and off for 1 week    [provider]         Mardella LaymanHagler, Torianne Laflam, MD 01/24/17 1438

## 2017-01-24 NOTE — ED Triage Notes (Signed)
Pt broke her left toe a week ago and they told her to follow up a week later and her primary is closed on sat. So she came here. It's her next to the last toe and wanted to make sure it was healing properly.

## 2017-02-27 ENCOUNTER — Ambulatory Visit (HOSPITAL_COMMUNITY)
Admission: EM | Admit: 2017-02-27 | Discharge: 2017-02-27 | Disposition: A | Discharge status: Home / Self Care | Payer: 59 | Attending: Internal Medicine | Admitting: Internal Medicine

## 2017-02-27 ENCOUNTER — Encounter (HOSPITAL_COMMUNITY): Payer: Self-pay | Admitting: Emergency Medicine

## 2017-02-27 DIAGNOSIS — H9202 Otalgia, left ear: Secondary | ICD-10-CM | POA: Diagnosis not present

## 2017-02-27 DIAGNOSIS — M79672 Pain in left foot: Secondary | ICD-10-CM | POA: Diagnosis not present

## 2017-02-27 DIAGNOSIS — N920 Excessive and frequent menstruation with regular cycle: Secondary | ICD-10-CM

## 2017-02-27 DIAGNOSIS — N939 Abnormal uterine and vaginal bleeding, unspecified: Secondary | ICD-10-CM | POA: Insufficient documentation

## 2017-02-27 DIAGNOSIS — N938 Other specified abnormal uterine and vaginal bleeding: Secondary | ICD-10-CM

## 2017-02-27 DIAGNOSIS — Z3202 Encounter for pregnancy test, result negative: Secondary | ICD-10-CM | POA: Diagnosis not present

## 2017-02-27 LAB — POCT URINALYSIS DIP (DEVICE)
BILIRUBIN URINE: NEGATIVE
Glucose, UA: NEGATIVE mg/dL
HGB URINE DIPSTICK: NEGATIVE
Ketones, ur: NEGATIVE mg/dL
NITRITE: POSITIVE — AB
PH: 6.5 (ref 5.0–8.0)
Protein, ur: NEGATIVE mg/dL
Specific Gravity, Urine: 1.025 (ref 1.005–1.030)
UROBILINOGEN UA: 0.2 mg/dL (ref 0.0–1.0)

## 2017-02-27 LAB — POCT PREGNANCY, URINE: PREG TEST UR: NEGATIVE

## 2017-02-27 NOTE — ED Triage Notes (Signed)
PT reports LMP was 01/25/17. PT had 2 days of spotting 7/19, but not a full cycle. PT would like preg test. PT reports left ear pain for 3 days. PT reports she broke a toe on left foot 6/8 and would like that checked.

## 2017-02-27 NOTE — ED Provider Notes (Signed)
CSN: [id number]     Arrival date & time 02/27/17  1801 History   None    Chief Complaint  Patient presents with  . Possible Pregnancy  . Ear Pain   (Consider location/radiation/quality/duration/timing/severity/associated sxs/prior Treatment) HPI Monica Armstrong is a 26 y.o. female presenting to UC with request for a blood pregnancy test.  LMP 01/25/17.  She has been spotting since yesterday, 7/19.  Denies abdominal pain, nausea or vomiting.  She does not use condoms and is not on birth control. Pt states she is not trying to become pregnant.  She does not currently have a PCP.   Pt also c/o Left ear pain that started 3 days ago. Denies cough, congestion, sore throat or headache.  She has not taken anything for symptoms.  Pt also c/o continued Left foot pain. Was [facility] at [facility] for [facility] 4th toe fracture.  She was seen again in UC for same and advised to f/u with PCP. She has not f/u with PCP as she does not have one anymore. No new injuries.    History reviewed. No pertinent past medical history. History reviewed. No pertinent surgical history. Family History  Problem Relation Age of Onset  . Heart disease Paternal Grandmother    Social History  Substance Use Topics  . Smoking status: Never Smoker  . Smokeless tobacco: Never Used  . Alcohol use 0.0 oz/week   OB History    Gravida Para Term Preterm AB Living   1             SAB TAB Ectopic Multiple Live Births                 Review of Systems  Constitutional: Negative for chills and fever.  HENT: Positive for ear pain (Left). Negative for congestion, sore throat, trouble swallowing and voice change.   Respiratory: Negative for cough and shortness of breath.   Cardiovascular: Negative for chest pain and palpitations.  Gastrointestinal: Negative for abdominal pain, diarrhea, nausea and vomiting.  Genitourinary: Positive for menstrual problem (spotting). Negative for dysuria, pelvic pain, urgency and vaginal discharge.   Musculoskeletal: Negative for arthralgias, back pain and myalgias.  Skin: Negative for rash.    Allergies  Patient has no known allergies.  Home Medications   Prior to Admission medications   Medication Sig Start Date End Date Taking? Authorizing Provider  calamine lotion Apply 1 application topically as needed for itching. 01/20/17   Barrett HenleNadeau, Nicole Elizabeth, PA-C  doxycycline (VIBRAMYCIN) 100 MG capsule Take 1 capsule (100 mg total) by mouth 2 (two) times daily. One po bid x 7 days 09/22/16   Fayrene Helperran, Bowie, PA-C  hydrOXYzine (ATARAX/VISTARIL) 10 MG tablet Take 1 tablet (10 mg total) by mouth every 6 (six) hours as needed for itching. 01/20/17   Barrett HenleNadeau, Nicole Elizabeth, PA-C  metroNIDAZOLE (FLAGYL) 500 MG tablet Take 1 tablet (500 mg total) by mouth 2 (two) times daily. 09/22/16   Fayrene Helperran, Bowie, PA-C  norelgestromin-ethinyl estradiol (ORTHO EVRA) 150-35 MCG/24HR transdermal patch Place 1 patch onto the skin once a week. Apply patch once weekly on Friday for 3 weeks and off for 1 week    [provider]   Meds Ordered and Administered this Visit  Medications - No data to display  BP 121/78   Pulse 86   Temp 98.6 F (37 C) (Oral)   Resp 16   Ht 4\' 11"  (1.499 m)   Wt 103 lb (46.7 kg)   LMP 01/25/2017  SpO2 98%   BMI 20.80 kg/m  No data found.   Physical Exam  Constitutional: She is oriented to person, place, and time. She appears well-developed and well-nourished. No distress.  HENT:  Head: Normocephalic and atraumatic.  Right Ear: Tympanic membrane normal.  Left Ear: Tympanic membrane normal.  Nose: Nose normal.  Mouth/Throat: Uvula is midline, oropharynx is clear and moist and mucous membranes are normal.  Eyes: EOM are normal.  Neck: Normal range of motion.  Cardiovascular: Normal rate and regular rhythm.   Pulmonary/Chest: Effort normal and breath sounds normal. No respiratory distress. She has no wheezes. She has no rales.  Abdominal: Soft. She exhibits no  distension. There is no tenderness. There is no guarding.  Musculoskeletal: Normal range of motion.  Neurological: She is alert and oriented to person, place, and time.  Skin: Skin is warm and dry. She is not diaphoretic.  Psychiatric: She has a normal mood and affect. Her behavior is normal.  Nursing note and vitals reviewed.   Urgent Care Course     Procedures (including critical care time)  Labs Review Labs Reviewed  POCT URINALYSIS DIP (DEVICE) - Abnormal; Notable for the following:       Result Value   Nitrite POSITIVE (*)    Leukocytes, UA TRACE (*)    All other components within normal limits  URINE CULTURE  POCT PREGNANCY, URINE    Imaging Review No results found.    MDM   1. Encounter for pregnancy test with result negative   2. Spotting   3. Left ear pain    UA was performed in triage despite pt not c/o urinary symptoms.  UA positive for nitrites. Will send culture and hold off on antibiotics as pt not having symptoms.    Exam- unremarkable. Left TM- norma. Abd- soft, non-tender.   Urine preg: Negative Discussed with pt that blood pregnancy test was not medically indicated at this time. Encouraged f/u with PCP and to establish care with an OB/GYN for women's health related needs including spotting and to discuss birth control.  [employer] guide provided.     Lurene Shadow, PA-C 02/27/17 2000

## 2017-02-27 NOTE — Discharge Instructions (Signed)
°Emergency Department Resource Guide °1) Find a Doctor and Pay Out of Pocket °Although you won't have to find out who is covered by your insurance plan, it is a good idea to ask around and get recommendations. You will then need to call the office and see if the doctor you have chosen will accept you as a new patient and what types of options they offer for patients who are self-pay. Some doctors offer discounts or will set up payment plans for their patients who do not have insurance, but you will need to ask so you aren't surprised when you get to your appointment. ° °2) Contact Your Local Health Department °Not all health departments have doctors that can see patients for sick visits, but many do, so it is worth a call to see if yours does. If you don't know where your local health department is, you can check in your phone book. The CDC also has a tool to help you locate your state's health department, and many state websites also have listings of all of their local health departments. ° °3) Find a Walk-in Clinic °If your illness is not likely to be very severe or complicated, you may want to try a walk in clinic. These are popping up all over the country in pharmacies, drugstores, and shopping centers. They're usually staffed by nurse practitioners or physician assistants that have been trained to treat common illnesses and complaints. They're usually fairly quick and inexpensive. However, if you have serious medical issues or chronic medical problems, these are probably not your best option. ° °No Primary Care Doctor: °- Call [facility] at  832-8000 - they can help you locate a primary care doctor that  accepts your insurance, provides certain services, etc. °- Physician Referral Service- 1-800-533-3463 ° °Chronic Pain Problems: °Organization         Address  Phone   Notes  °[facility]  ([phone]) [phone] Patients need to be referred by their primary care doctor.  ° °Medication  Assistance: °Organization         Address  Phone   Notes  °[place] [facility] [address] °[place], [place] [address] (336) 641-8030 --Must be a resident of [place] °-- Must have NO insurance coverage whatsoever (no Medicaid/ Medicare, etc.) °-- The pt. MUST have a primary care doctor that directs their care regularly and follows them in the community °  °[facility]  (866) 331-1348   °[employer]  (888) 892-1162   ° °Agencies that provide inexpensive medical care: °Organization         Address  Phone   Notes  °[facility]  (336) 832-8035   °[facility]    (336) 832-7272   °[facility] [address] °[place], [place] [address] (336) 832-4777   °[facility] [address], °[place] (336) 271-4999   °[facility]    (336) 373-0678   °[facility]    (336) 272-1050   °[facility] ° [address], [place] Phone:  (336) 832-4444, Fax:  (336) 832-4440 Hours of Operation:  9 am - 6 pm, M-F.  Also accepts Medicaid/Medicare and self-pay.  °[facility] ° [address],  Phone: (336) [phone], Fax: (336) [phone]. Hours of Operation:  8:30 am - 5:30 pm, M-F.  Also accepts Medicaid and self-pay.  °[facility] [place] [address], [place] Phone: (336) 878-6027   °[facility] [address], [place], [place] (336)723-1848, Ext. 123 Mondays & Thursdays: 7-9 AM.  First 15 patients are seen on a first come, first serve basis. °  ° °Medicaid-accepting [place] Providers: ° °Organization         Address  Phone   Notes  °[facility] [address], [place] (336) [phone] Also accepts self-pay patients.  °[facility] [address], [place] ° (336) [phone]   °[facility] [address], [place]  (336) 288-8857   °[facility] [address] [place] [address], [place] (336) 299-7000   °Veita Bland [address], [place]  ° (336) [phone] Only accepts [place] Access Medicaid patients after they have their name applied to their card.  ° °Self-Pay (no insurance) in [place]: ° °Organization         Address  Phone   Notes  °Sickle Cell Patients, [facility] [address], [place] (336) 832-1970   °[facility] [address], [place] (336) 832-4400   °[facility] ° [address], [place] (336) 992-4800   °[facility]/Dr. Osei-Bonsu ° [address] [place] [address], [place] or [address], [place] (336) [phone] Phone number for both [place] and [place] locations is the same.  °[facility] [address], [place] (336) [phone]   °[facility] [address] [place] [address], [place] or [address] (336) 852-7530 °(336) 878-2260   °[facility] [address], [place] (336) 350-1642, phone; (336) 294-5005, fax Sees patients 1st and 3rd Saturday of every month.  Must not qualify for public or private insurance (i.e. Medicaid, Medicare, [place] Health Choice, Veterans' Benefits) • Household income should be no more than 200% of the poverty level •The clinic cannot treat you if you are pregnant or think you are pregnant • Sexually transmitted diseases are not treated at the clinic.  ° ° °Dental Care: °Organization         Address  Phone  Notes  °[place] [facility] [address], [place] (336) 641-6152 Accepts children up to age 21 who are enrolled in Medicaid or [place] Health Choice; pregnant women with a Medicaid card; and children who have applied for Medicaid or [place] Health Choice, but were declined, whose parents can pay a reduced fee at time of service.  °[place]  [facility] [place]  [address], [place] (336) 641-7733 Accepts children up to age 21 who are enrolled in Medicaid or [place] Health Choice; pregnant women with a Medicaid card; and children who have applied for Medicaid or [place] Health Choice, but were declined, whose parents can pay a reduced fee at time of service.  °[facility] ° [address], [place] (336) 641-4533 Patients are seen by appointment only. Walk-ins are not accepted. [facility] will see patients 18 years of age and older. °Monday - Tuesday (8am-5pm) °Most Wednesdays (8:30-5pm) °$30 per visit, cash only  °[facility] ° [address], [place] (336) [phone] Patients are seen by appointment only. Walk-ins are not accepted. [facility] will see patients 18 years of age and older. °One   Wednesday Evening (Monthly: Volunteer Based).  $30 per visit, cash only  °[facility]  (919) 537-3737 for adults; Children under age 4, call Graduate Pediatric Dentistry at ([phone]) [phone]. Children aged 4-14, please call ([phone]) [phone] to request a pediatric application. ° Dental services are provided in all areas of dental care including fillings, crowns and bridges, complete and partial dentures, implants, gum treatment, root canals, and extractions. Preventive care is also provided. Treatment is provided to both adults and children. °Patients are selected via a lottery and there is often a waiting list. °  °[facility] [address], °[place] ° (336) 763-8833 [email or web address] °  °[facility] [address], [place], [place] (336)723-1848, Ext. 123 Second and Fourth Thursday of each month, opens at 6:30 AM; Clinic ends at 9 AM.  Patients are seen on a first-come first-served basis, and a limited number are seen during each clinic.  ° °[facility] ° [address], [place], [place] (336) [phone]    Eligibility Requirements °You must have lived in [place], [place], or [place] counties for at least the last three months. °  You cannot be eligible for state or federal sponsored healthcare insurance, including Veterans Administration, Medicaid, or Medicare. °  You generally cannot be eligible for healthcare insurance through your employer.  °  How to apply: °Eligibility screenings are held every Tuesday and Wednesday afternoon from 1:00 pm until 4:00 pm. You do not need an appointment for the interview!  °[facility] [address], [place], [place] 336-631-2330   °[facility]  336-342-8273   °[facility]  336-703-3100   °[facility]  336-570-6415   ° °Behavioral Health Resources in the Community: °Intensive Outpatient Programs °Organization         Address  Phone  Notes  °[facility] [address], [place], [place] 336-878-6098   °[facility] [address], [place], [place] 336-832-9800   °[facility] [address], [place], [place] ° 336-882-2125   °[facility] [address],  °[place], [place] 1-800-853-5163 or 336-641-4981   °Substance Abuse Resources °Organization         Address  Phone  Notes  °[facility]  336-882-2125   °[facility]  336-784-9470   °[facility]  336-285-9073   °[facility]  336-845-3988   °Residential & Outpatient Substance Abuse Program  1-800-659-3381   °Psychological Services °Organization         Address  Phone  Notes  °[facility]  336- 832-9600   °[facility]  336- 378-7881   °[facility] [address], [place] 1-800-853-5163 or 336-641-4981   ° °Mobile Crisis Teams °Organization         Address  Phone  Notes  °[facility], Mobile Crisis Care Unit  1-877-626-1772   °Assertive °[facility] ° [address]  [place], [place] 336-834-9664   °Sharon DeEsch [address] °[place]  336-554-5454   ° °Self-Help/Support Groups °Organization         Address  Phone             Notes  °[facility] - variety of support groups  336- 373-1402 Call for more information  °Narcotics Anonymous (NA), [facility] [address], °[place]   2 meetings at this location  ° °

## 2017-03-02 LAB — URINE CULTURE: Culture: >=100000 — AB

## 2017-03-03 ENCOUNTER — Telehealth (HOSPITAL_COMMUNITY): Payer: Self-pay | Admitting: Emergency Medicine

## 2017-03-03 MED ORDER — CEPHALEXIN 500 MG PO CAPS: 500.0000 mg | ORAL_CAPSULE | Freq: Two times a day (BID) | ORAL | 0 refills | Status: DC

## 2017-03-03 NOTE — Telephone Encounter (Signed)
Called pt to notify of recent lab results.... Pt ID'd properly Reports feeling the same... Having abd pain, urinary urgency w/foul smell.  Per pt's request... Sent Rx to [facility]  Adv pt if sx are not getting better to return or to f/u w/PCP Notified pt that lab results can be obtained through MyChart Pt verb understanding.

## 2017-03-03 NOTE — Telephone Encounter (Signed)
-----   Message from Eustace MooreLaura W Murray, MD sent at 03/02/2017  8:40 PM EDT ----- Clinical staff, please let patient know that urine culture was positive for E coli germ.  This finding is of uncertain significance, since no urinary symptoms and no abdominal/pelvic pain were reported.  No fever, no alarm signs documented on physical exam. If patient has no urinary discomfort, no treatment is needed.   If she is having urinary frequency/urgency/discomfort, ok to send rx for cephalexin, 500mg  bid x 5d #10 no refills, to pharmacy.  Recheck or followup with primary care provider for further evaluation if symptoms are not improving.  Result note copied to patient's MyChart.  LM

## 2017-05-20 ENCOUNTER — Encounter (HOSPITAL_COMMUNITY): Payer: Self-pay | Admitting: *Deleted

## 2017-05-20 ENCOUNTER — Inpatient Hospital Stay (HOSPITAL_COMMUNITY)
Admission: AD | Admit: 2017-05-20 | Discharge: 2017-05-20 | Disposition: A | Discharge status: Home / Self Care | Payer: 59 | Source: Ambulatory Visit | Attending: Obstetrics and Gynecology | Admitting: Obstetrics and Gynecology

## 2017-05-20 ENCOUNTER — Inpatient Hospital Stay (HOSPITAL_COMMUNITY): Payer: 59

## 2017-05-20 DIAGNOSIS — R109 Unspecified abdominal pain: Secondary | ICD-10-CM | POA: Diagnosis present

## 2017-05-20 DIAGNOSIS — Z6791 Unspecified blood type, Rh negative: Secondary | ICD-10-CM

## 2017-05-20 DIAGNOSIS — Z3A08 8 weeks gestation of pregnancy: Secondary | ICD-10-CM | POA: Diagnosis not present

## 2017-05-20 DIAGNOSIS — O26891 Other specified pregnancy related conditions, first trimester: Secondary | ICD-10-CM | POA: Diagnosis not present

## 2017-05-20 DIAGNOSIS — O209 Hemorrhage in early pregnancy, unspecified: Secondary | ICD-10-CM | POA: Insufficient documentation

## 2017-05-20 DIAGNOSIS — Z6721 Type B blood, Rh negative: Secondary | ICD-10-CM | POA: Diagnosis not present

## 2017-05-20 HISTORY — DX: Gastro-esophageal reflux disease without esophagitis: K21.9

## 2017-05-20 LAB — URINALYSIS, ROUTINE W REFLEX MICROSCOPIC
Bilirubin Urine: NEGATIVE
GLUCOSE, UA: NEGATIVE mg/dL
Hgb urine dipstick: NEGATIVE
Ketones, ur: NEGATIVE mg/dL
Nitrite: NEGATIVE
PH: 7 (ref 5.0–8.0)
PROTEIN: NEGATIVE mg/dL
Specific Gravity, Urine: 1.016 (ref 1.005–1.030)

## 2017-05-20 LAB — ABO/RH: ABO/RH(D): B NEG

## 2017-05-20 LAB — WET PREP, GENITAL
CLUE CELLS WET PREP: NONE SEEN
Sperm: NONE SEEN
TRICH WET PREP: NONE SEEN
YEAST WET PREP: NONE SEEN

## 2017-05-20 LAB — POCT PREGNANCY, URINE: Preg Test, Ur: POSITIVE — AB

## 2017-05-20 MED ORDER — RHO D IMMUNE GLOBULIN 1500 UNIT/2ML IJ SOSY
300.0000 ug | PREFILLED_SYRINGE | Freq: Once | INTRAMUSCULAR | Status: AC
  Administered 2017-05-20: 300 ug via INTRAMUSCULAR
  Filled 2017-05-20: qty 2

## 2017-05-20 NOTE — MAU Note (Signed)
+  lower mid abdominal pain And pain in right groin area Rating pain 8/10 Has not taken any medication for it Intermittent sharp   +vaginal spotting noted this am Pink-red  LMP 8/13  Plans on going to [facility] for [facility] but states has not started care yet--appointment on 10/17

## 2017-05-20 NOTE — MAU Provider Note (Signed)
History     CSN: [id number]  Arrival date and time: 05/20/17 1242   First Provider Initiated Contact with Patient 05/20/17 1330      Chief Complaint  Patient presents with  . Abdominal Pain  . Vaginal Bleeding   HPI   Ms.Monica Armstrong is a 26 y.o. female G2P0010 @ [redacted]w[redacted]d here in MAU with abdominal pain and vaginal bleeding. States she pain started yesterday morning. The pain has gotten worse, states the pain became worse last night and then eased off this morning. The pain comes and goes. The pain is located in the lower part of her stomach and it is a sharp pain. She has not taken anything medications for the pain. The bleeding is pink. The bleeding started this morning. No recent intercourse.   OB History    Gravida Para Term Preterm AB Living   2         0   SAB TAB Ectopic Multiple Live Births                  Past Medical History:  Diagnosis Date  . GERD (gastroesophageal reflux disease)     Past Surgical History:  Procedure Laterality Date  . NO PAST SURGERIES      Family History  Problem Relation Age of Onset  . Heart disease Paternal Grandmother   . Cancer Paternal Grandmother     Social History  Substance Use Topics  . Smoking status: Never Smoker  . Smokeless tobacco: Never Used  . Alcohol use 0.0 oz/week    Allergies: No Known Allergies  Prescriptions Prior to Admission  Medication Sig Dispense Refill Last Dose  . calamine lotion Apply 1 application topically as needed for itching. 120 mL 0   . cephALEXin (KEFLEX) 500 MG capsule Take 1 capsule (500 mg total) by mouth 2 (two) times daily. 10 capsule 0   . hydrOXYzine (ATARAX/VISTARIL) 10 MG tablet Take 1 tablet (10 mg total) by mouth every 6 (six) hours as needed for itching. 30 tablet 0   . norelgestromin-ethinyl estradiol (ORTHO EVRA) 150-35 MCG/24HR transdermal patch Place 1 patch onto the skin once a week. Apply patch once weekly on Friday for 3 weeks and off for 1 week   09/12/2016   Results  for orders placed or performed during the hospital encounter of 05/20/17 (from the past 48 hour(s))  Urinalysis, Routine w reflex microscopic     Status: Abnormal   Collection Time: 05/20/17 12:51 PM  Result Value Ref Range   Color, Urine YELLOW YELLOW   APPearance HAZY (A) CLEAR   Specific Gravity, Urine 1.016 1.005 - 1.030   pH 7.0 5.0 - 8.0   Glucose, UA NEGATIVE NEGATIVE mg/dL   Hgb urine dipstick NEGATIVE NEGATIVE   Bilirubin Urine NEGATIVE NEGATIVE   Ketones, ur NEGATIVE NEGATIVE mg/dL   Protein, ur NEGATIVE NEGATIVE mg/dL   Nitrite NEGATIVE NEGATIVE   Leukocytes, UA TRACE (A) NEGATIVE   RBC / HPF 0-5 0 - 5 RBC/hpf   WBC, UA 6-30 0 - 5 WBC/hpf   Bacteria, UA RARE (A) NONE SEEN   Squamous Epithelial / LPF 0-5 (A) NONE SEEN   Mucus PRESENT   Pregnancy, urine POC     Status: Abnormal   Collection Time: 05/20/17  1:04 PM  Result Value Ref Range   Preg Test, Ur POSITIVE (A) NEGATIVE    Comment:        THE SENSITIVITY OF THIS METHODOLOGY IS >24 mIU/mL   Wet prep,  genital     Status: Abnormal   Collection Time: 05/20/17  1:38 PM  Result Value Ref Range   Yeast Wet Prep HPF POC NONE SEEN NONE SEEN   Trich, Wet Prep NONE SEEN NONE SEEN   Clue Cells Wet Prep HPF POC NONE SEEN NONE SEEN   WBC, Wet Prep HPF POC FEW (A) NONE SEEN    Comment: FEW BACTERIA SEEN   Sperm NONE SEEN   Rh IG workup (includes ABO/Rh)     Status: None (Preliminary result)   Collection Time: 05/20/17  2:00 PM  Result Value Ref Range   Gestational Age(Wks) 8    ABO/RH(D) B NEG    Antibody Screen NEG    Unit Number [id number]    Blood Component Type RHIG    Unit division 00    Status of Unit ISSUED    Transfusion Status OK TO TRANSFUSE    US Ob Comp Less 14 Wks  Result Date: 05/20/2017 CLINICAL DATA:  26 year old pregnant female presents with mid abdominal pain and vaginal spotting today. EDC by LMP: 12/28/2017, projecting to an expected gestational age of [redacted] weeks 2 days. EXAM: OBSTETRIC <14 WK [place]  AND TRANSVAGINAL OB US TECHNIQUE: Both transabdominal and transvaginal ultrasound examinations were performed for complete evaluation of the gestation as well as the maternal uterus, adnexal regions, and pelvic cul-de-sac. Transvaginal technique was performed to assess early pregnancy. COMPARISON:  No prior scans from this gestation. 03/13/2015 pelvic sonogram. FINDINGS: Intrauterine gestational sac: Single intrauterine gestational sac appears normal in position and contour. Subjectively, there is oligohydramnios. Yolk sac:  Not Visualized. Embryo:  Visualized. Embryonic Cardiac Activity: Visualized. Embryonic Heart Rate: 130  bpm CRL:  9.2  mm   6 w   6 d                  [place] EDC: 01/07/2018 Subchorionic hemorrhage:  None visualized. Maternal uterus/adnexae: Left ovary measures 3.4 x 2.8 x 3.1 cm and contains a corpus luteum. Right ovary measures 4.1 x 2.4 x 2.7 cm. No suspicious ovarian or adnexal masses. No abnormal free fluid in the pelvis. No uterine fibroids. IMPRESSION: 1. Single living intrauterine gestation at 6 weeks 6 days by crown-rump length. First-trimester oligohydramnios and nonvisualization of the yolk sac. These findings are worrisome prognostic factors. Follow-up obstetric ultrasound in 4 weeks recommended to assess for appropriate fetal growth. 2. No ovarian or adnexal abnormality. Electronically Signed   By: Delbert Phenix M.D.   On: 05/20/2017 16:26   US Ob Transvaginal  Result Date: 05/20/2017 CLINICAL DATA:  26 year old pregnant female presents with mid abdominal pain and vaginal spotting today. EDC by LMP: 12/28/2017, projecting to an expected gestational age of [redacted] weeks 2 days. EXAM: OBSTETRIC <14 WK [place] AND TRANSVAGINAL OB US TECHNIQUE: Both transabdominal and transvaginal ultrasound examinations were performed for complete evaluation of the gestation as well as the maternal uterus, adnexal regions, and pelvic cul-de-sac. Transvaginal technique was performed to assess early pregnancy.  COMPARISON:  No prior scans from this gestation. 03/13/2015 pelvic sonogram. FINDINGS: Intrauterine gestational sac: Single intrauterine gestational sac appears normal in position and contour. Subjectively, there is oligohydramnios. Yolk sac:  Not Visualized. Embryo:  Visualized. Embryonic Cardiac Activity: Visualized. Embryonic Heart Rate: 130  bpm CRL:  9.2  mm   6 w   6 d                  [place] EDC: 01/07/2018 Subchorionic hemorrhage:  None visualized. Maternal uterus/adnexae: Left ovary  measures 3.4 x 2.8 x 3.1 cm and contains a corpus luteum. Right ovary measures 4.1 x 2.4 x 2.7 cm. No suspicious ovarian or adnexal masses. No abnormal free fluid in the pelvis. No uterine fibroids. IMPRESSION: 1. Single living intrauterine gestation at 6 weeks 6 days by crown-rump length. First-trimester oligohydramnios and nonvisualization of the yolk sac. These findings are worrisome prognostic factors. Follow-up obstetric ultrasound in 4 weeks recommended to assess for appropriate fetal growth. 2. No ovarian or adnexal abnormality. Electronically Signed   By: Delbert Phenix M.D.   On: 05/20/2017 16:26   Review of Systems  Constitutional: Negative for fever.  Gastrointestinal: Positive for abdominal pain. Negative for constipation, nausea and vomiting.  Genitourinary: Positive for vaginal bleeding. Negative for dysuria.   Physical Exam   Blood pressure (!) 101/59, pulse 77, temperature 98.1 F (36.7 C), temperature source Oral, resp. rate 16, weight 109 lb 1.9 oz (49.5 kg), last menstrual period 03/23/2017, SpO2 100 %.  Physical Exam  Constitutional: She is oriented to person, place, and time. She appears well-developed and well-nourished. No distress.  HENT:  Head: Normocephalic.  Eyes: Pupils are equal, round, and reactive to light.  Neck: Neck supple.  GI: Soft. Normal appearance. There is tenderness in the suprapubic area. There is no rigidity, no rebound and no guarding.  Genitourinary:  Genitourinary  Comments: Vagina - Small amount of white vaginal discharge, no odor. Speck of pink noted.  Cervix - No contact bleeding, no active bleeding  Bimanual exam: Cervix closed, posterior  Uterus non tender, enlarged  GC/Chlam, wet prep done Chaperone present for exam.   Musculoskeletal: Normal range of motion.  Neurological: She is alert and oriented to person, place, and time.  Skin: Skin is warm. She is not diaphoretic.  Psychiatric: Her behavior is normal.   MAU Course  Procedures  None  MDM  Wet prep & GC collected US done  B negative blood type Rhogam injection given today in MAU  Consulted with Dr. Rana Snare, ok for DC home.   Assessment and Plan   A:  1. Vaginal bleeding in pregnancy, first trimester   2. Rh negative state in antepartum period, first trimester     P:  Discharge home with bleeding precautions Pelvic rest encouraged Follow up with OB as scheduled Return to MAU if symptoms worsen  Rhogam given  Venia Carbon I, NP 05/20/2017 5:54 PM

## 2017-05-20 NOTE — Discharge Instructions (Signed)
Rh Incompatibility Rh incompatibility is a condition that occurs during pregnancy if a woman has Rh-negative blood and her baby has Rh-positive blood. "Rh-negative" and "Rh-positive" refer to whether or not the blood has an Rh factor. An Rh factor is a specific protein found on the surface of red blood cells. If a woman has Rh factor, she is Rh-positive. If she does not have an Rh factor, she is Rh-negative. Having or not having an Rh factor does not affect the mother's general health. However, it can cause problems during pregnancy. What kind of problems can Rh incompatibility cause? During pregnancy, blood from the baby can cross into the mother's bloodstream, especially during delivery. If a mother is Rh-negative and the baby is Rh-positive, the mother's defense system will react to the baby's blood as if it was a foreign substance and will create proteins (antibodies). This is called sensitization. Once the mother is sensitized, her Rh antibodies will cross the placenta to the baby and attack the baby's Rh-positive blood as if it is a harmful substance. Rh incompatibility can also happen if the Rh-negative pregnant woman is exposed to the Rh factor during a blood transfusion with Rh-positive blood. How does this condition affect my baby? The Rh antibodies that attack and destroy the baby's red blood cells can lead to hemolytic disease in the baby. Hemolytic disease is when the red blood cells break down. This can cause:  Yellowing of the skin and eyes (jaundice).  The body to not have enough healthy red blood cells (anemia).  Brain damage.  Heart failure.  Death.  These antibodies usually do not cause problems during a first pregnancy. This is because the blood from the baby often times crosses into the mother's bloodstream during delivery, and the baby is born before many of the antibodies can develop. However, the antibodies stay in your body once they have formed. Because of this, Rh  incompatibility is more likely to cause problems in second or later pregnancies (if the baby is Rh-positive). How is this diagnosed? When a woman becomes pregnant, blood tests may be done to find out her blood type and Rh factor. If the woman is Rh-negative, she also may have another blood test called an antibody screen. The antibody screen shows whether she has Rh antibodies in her blood. If she does, it means she was exposed to Rh-positive blood before, and she is at risk for Rh incompatibility. To find out whether the baby is developing hemolytic anemia and how serious it is, caregivers may use more advanced tests, such as ultrasonography (commonly known as ultrasound). How is Rh incompatibility treated? Rh incompatibility is treated with a shot of medicine called Rho (D) immune globulin. This medicine keeps the woman's body from making antibodies that can cause serious problems in the baby or future babies. Two shots will be given, one at around your seventh month of pregnancy and the other within 72 hours of your baby being born. If you are Rh-negative, you will need this medicine every time you have a baby with Rh-positive blood. If you already have antibodies in your blood, Rho (D) immune globulin will not help. Your doctor will not give you this medicine, but will watch your pregnancy closely for problems instead. This shot may also be given to an Rh-negative woman when the risk of blood transfer between the mom and baby is high. The risk is high with:  An amniocentesis.  A miscarriage or an abortion.  An ectopic pregnancy.  Any   vaginal bleeding during pregnancy.  This information is not intended to replace advice given to you by your health care provider. Make sure you discuss any questions you have with your health care provider. Document Released: 01/17/2002 Document Revised: 01/03/2016 Document Reviewed: 11/09/2012 Elsevier Interactive Patient Education  2017 [employer]  

## 2017-05-20 NOTE — MAU Note (Signed)
Pt presents with c/o sharp lower mid abdominal pain that began yesterday morning.  Pt reports she started spotting this morning.

## 2017-05-21 LAB — GC/CHLAMYDIA PROBE AMP (~~LOC~~) NOT AT ARMC
Chlamydia: NEGATIVE
NEISSERIA GONORRHEA: NEGATIVE

## 2017-05-21 LAB — RH IG WORKUP (INCLUDES ABO/RH)
ABO/RH(D): B NEG
Antibody Screen: NEGATIVE
GESTATIONAL AGE(WKS): 8
Unit division: 0

## 2017-09-07 ENCOUNTER — Encounter (HOSPITAL_COMMUNITY): Payer: Self-pay | Admitting: Emergency Medicine

## 2017-09-07 ENCOUNTER — Ambulatory Visit (HOSPITAL_COMMUNITY)
Admission: EM | Admit: 2017-09-07 | Discharge: 2017-09-07 | Disposition: A | Discharge status: Home / Self Care | Payer: 59 | Attending: Family Medicine | Admitting: Family Medicine

## 2017-09-07 DIAGNOSIS — K219 Gastro-esophageal reflux disease without esophagitis: Secondary | ICD-10-CM | POA: Diagnosis not present

## 2017-09-07 DIAGNOSIS — Z79899 Other long term (current) drug therapy: Secondary | ICD-10-CM | POA: Insufficient documentation

## 2017-09-07 DIAGNOSIS — R1032 Left lower quadrant pain: Secondary | ICD-10-CM | POA: Diagnosis not present

## 2017-09-07 DIAGNOSIS — M549 Dorsalgia, unspecified: Secondary | ICD-10-CM | POA: Diagnosis present

## 2017-09-07 DIAGNOSIS — Z87898 Personal history of other specified conditions: Secondary | ICD-10-CM | POA: Diagnosis not present

## 2017-09-07 DIAGNOSIS — M545 Low back pain, unspecified: Secondary | ICD-10-CM

## 2017-09-07 DIAGNOSIS — R109 Unspecified abdominal pain: Secondary | ICD-10-CM | POA: Diagnosis present

## 2017-09-07 LAB — POCT URINALYSIS DIP (DEVICE)
BILIRUBIN URINE: NEGATIVE
Glucose, UA: NEGATIVE mg/dL
KETONES UR: NEGATIVE mg/dL
Nitrite: NEGATIVE
PH: 6.5 (ref 5.0–8.0)
PROTEIN: NEGATIVE mg/dL
SPECIFIC GRAVITY, URINE: 1.02 (ref 1.005–1.030)
Urobilinogen, UA: 0.2 mg/dL (ref 0.0–1.0)

## 2017-09-07 LAB — POCT PREGNANCY, URINE: PREG TEST UR: NEGATIVE

## 2017-09-07 MED ORDER — POLYETHYLENE GLYCOL 3350 17 G PO PACK: 17.0000 g | PACK | Freq: Every day | ORAL | 0 refills | Status: DC

## 2017-09-07 MED ORDER — MELOXICAM 7.5 MG PO TABS: 7.5000 mg | ORAL_TABLET | Freq: Every day | ORAL | 0 refills | Status: DC

## 2017-09-07 MED ORDER — DOCUSATE SODIUM 50 MG PO CAPS: 50.0000 mg | ORAL_CAPSULE | Freq: Two times a day (BID) | ORAL | 0 refills | Status: DC

## 2017-09-07 NOTE — ED Provider Notes (Signed)
[facility]    CSN: [id number] Arrival date & time: 09/07/17  1543     History   Chief Complaint Chief Complaint  Patient presents with  . Abdominal Pain  . Back Pain    HPI Monica Armstrong is a 27 y.o. female.   27 year old female comes in for multiple complaints.   1. Intermittent LLQ pain for 2 days that is not associated with eating. States worse with intercourse. Denies nausea, vomiting. Still eating and drinking without problem. BM today without hard stools or straining. States prior to today had not had a bowel movement in 2 weeks. Patient had a recent miscarriage in 05/2017, states imaging was done to ensure complete passage. LMP 08/17/2017. Denies urinary symptom such as dysuria, hematuria, frequency. Denies vaginal symptoms such as discharge, itching/pain, spotting. Sexually active with 1 female partner, condom use. No other birth control use.  2.  2-week history of left lower back pain that radiates down to the leg.  She denies injury or trauma.  States long hours of standing and walking at work.  States symptoms started during work.  She has not tried anything for the pain.  Denies any aggravating or alleviating factor.  Denies numbness, tingling, loss of bladder or bowel control.      Past Medical History:  Diagnosis Date  . GERD (gastroesophageal reflux disease)     There are no active problems to display for this patient.   Past Surgical History:  Procedure Laterality Date  . NO PAST SURGERIES      OB History    Gravida Para Term Preterm AB Living   2 0 0 0 1 0   SAB TAB Ectopic Multiple Live Births   0 1 0 0 0       Home Medications    Prior to Admission medications   Medication Sig Start Date End Date Taking? Authorizing Provider  calamine lotion Apply 1 application topically as needed for itching. Patient not taking: Reported on 05/20/2017 01/20/17   Barrett HenleNadeau, Nicole Elizabeth, PA-C  docusate sodium (COLACE) 50 MG capsule Take 1 capsule  (50 mg total) by mouth 2 (two) times daily. 09/07/17   Cathie HoopsYu, Amy V, PA-C  meloxicam (MOBIC) 7.5 MG tablet Take 1 tablet (7.5 mg total) by mouth daily. 09/07/17   Cathie HoopsYu, Amy V, PA-C  polyethylene glycol (MIRALAX) packet Take 17 g by mouth daily. 09/07/17   Belinda FisherYu, Amy V, PA-C    Family History Family History  Problem Relation Age of Onset  . Heart disease Paternal Grandmother   . Cancer Paternal Grandmother     Social History Social History   Tobacco Use  . Smoking status: Never Smoker  . Smokeless tobacco: Never Used  Substance Use Topics  . Alcohol use: Yes    Alcohol/week: 0.0 oz  . Drug use: No     Allergies   Patient has no known allergies.   Review of Systems Review of Systems  Reason unable to perform ROS: See HPI as above.     Physical Exam Triage Vital Signs ED Triage Vitals  Enc Vitals Group     BP 09/07/17 1646 100/71     Pulse Rate 09/07/17 1646 75     Resp 09/07/17 1646 16     Temp 09/07/17 1646 98.5 F (36.9 C)     Temp Source 09/07/17 1646 Oral     SpO2 09/07/17 1646 99 %     Weight --      Height --  Head Circumference --      Peak Flow --      Pain Score 09/07/17 1648 6     Pain Loc --      Pain Edu? --      Excl. in GC? --    No data found.  Updated Vital Signs BP 100/71 (BP Location: Right Arm)   Pulse 75   Temp 98.5 F (36.9 C) (Oral)   Resp 16   LMP 08/17/2017   SpO2 99%   Breastfeeding? No   Physical Exam  Constitutional: She is oriented to person, place, and time. She appears well-developed and well-nourished. No distress.  HENT:  Head: Normocephalic and atraumatic.  Eyes: Conjunctivae are normal. Pupils are equal, round, and reactive to light.  Neck: Normal range of motion. Neck supple.  Cardiovascular: Normal rate, regular rhythm and normal heart sounds. Exam reveals no gallop and no friction rub.  No murmur heard. Pulmonary/Chest: Effort normal and breath sounds normal. She has no wheezes. She has no rales.  Abdominal: Soft.  Bowel sounds are normal. She exhibits no mass. There is tenderness (Mild tenderness of LLQ/LLQ without guarding or rebound). There is no rebound, no guarding and no CVA tenderness.  Musculoskeletal:  No tenderness on palpation of the midline processes.  Tenderness with palpation of left lower lumbar region/hip.  Full range of motion back and hip. Strength normal and equal bilaterally. Sensation intact and equal bilaterally.  Negative straight leg raise.  Neurological: She is alert and oriented to person, place, and time.  Skin: Skin is warm and dry.  Psychiatric: She has a normal mood and affect. Her behavior is normal. Judgment normal.     UC Treatments / Results  Labs (all labs ordered are listed, but only abnormal results are displayed) Labs Reviewed  POCT URINALYSIS DIP (DEVICE) - Abnormal; Notable for the following components:      Result Value   Hgb urine dipstick TRACE (*)    Leukocytes, UA SMALL (*)    All other components within normal limits  URINE CULTURE  POCT PREGNANCY, URINE    EKG  EKG Interpretation None       Radiology No results found.  Procedures Procedures (including critical care time)  Medications Ordered in UC Medications - No data to display   Initial Impression / Assessment and Plan / UC Course  I have reviewed the triage vital signs and the nursing notes.  Pertinent labs & imaging results that were available during my care of the patient were reviewed by me and considered in my medical decision making (see chart for details).    Urine with trace blood and small leukocytes, will await urine culture for treatment.  No alarming signs on exam.  Discussed possible constipation causing symptoms.  Start Colace and MiraLAX as directed.  Push fluids.  Return precautions given.  Patient expresses understanding and agrees to plan.  Will start Mobic for left lower back pain.  Ice/heat compress.  Return precautions given.  Patient expresses understanding  and agrees to plan.  Final Clinical Impressions(s) / UC Diagnoses   Final diagnoses:  Acute left-sided low back pain without sciatica  Left lower quadrant pain    ED Discharge Orders        Ordered    meloxicam (MOBIC) 7.5 MG tablet  Daily     09/07/17 1746    docusate sodium (COLACE) 50 MG capsule  2 times daily     09/07/17 1746    polyethylene glycol (MIRALAX)  packet  Daily     09/07/17 1746       Belinda Fisher, PA-C 09/07/17 [phone]

## 2017-09-07 NOTE — Discharge Instructions (Signed)
Your urine showed some bacteria that could have been caused by skin cells, I am sending it off for culturing, if any bacteria grows out that needs antibiotics, we will give you a call and call in the medications then.  As discussed, left lower quadrant pain can be due to constipation.  Start Colace and MiraLAX as directed. Keep hydrated, your urine should be clear to pale yellow in color.  Monitor for any worsening symptoms, fever, nausea, vomiting, worsening pain, cannot tolerate jumping/car rides due to pain, go to the emergency department for further evaluation.  Left lower back/hip pain could be due to inflammation from the long hours of standing and walking.  Start Mobic as directed.  Ice/heat compress.  Monitor for any worsening of symptoms, numbness, tingling, loss of bladder or bowel control, go to the emergency department for further evaluation.

## 2017-09-07 NOTE — ED Triage Notes (Signed)
PT C/O: intermittent LLQ pain onset 2 days .... Reports sexual intercourse will increase pain as well.   Sts she has been constipated x2 week but had a BM today   Also reports lower back pain that radiates down the left leg onset 2 weeks.... Denies inj/trauma... Sts she is standing all day at work   TAKING MEDS: none   A&O x4... NAD... Ambulatory

## 2017-09-09 LAB — URINE CULTURE: Culture: NO GROWTH

## 2017-09-22 ENCOUNTER — Emergency Department (HOSPITAL_COMMUNITY): Payer: 59

## 2017-09-22 ENCOUNTER — Encounter (HOSPITAL_COMMUNITY): Payer: Self-pay

## 2017-09-22 ENCOUNTER — Emergency Department (HOSPITAL_COMMUNITY)
Admission: EM | Admit: 2017-09-22 | Discharge: 2017-09-22 | Disposition: A | Discharge status: Home / Self Care | Payer: 59 | Attending: Emergency Medicine | Admitting: Emergency Medicine

## 2017-09-22 ENCOUNTER — Other Ambulatory Visit: Payer: Self-pay

## 2017-09-22 DIAGNOSIS — F419 Anxiety disorder, unspecified: Secondary | ICD-10-CM | POA: Diagnosis not present

## 2017-09-22 DIAGNOSIS — R1030 Lower abdominal pain, unspecified: Secondary | ICD-10-CM | POA: Insufficient documentation

## 2017-09-22 DIAGNOSIS — R109 Unspecified abdominal pain: Secondary | ICD-10-CM

## 2017-09-22 LAB — COMPREHENSIVE METABOLIC PANEL
ALBUMIN: 4 g/dL (ref 3.5–5.0)
ALT: 10 U/L — ABNORMAL LOW (ref 14–54)
ANION GAP: 10 (ref 5–15)
AST: 16 U/L (ref 15–41)
Alkaline Phosphatase: 61 U/L (ref 38–126)
BILIRUBIN TOTAL: 0.7 mg/dL (ref 0.3–1.2)
BUN: 7 mg/dL (ref 6–20)
CO2: 23 mmol/L (ref 22–32)
Calcium: 9.3 mg/dL (ref 8.9–10.3)
Chloride: 105 mmol/L (ref 101–111)
Creatinine, Ser: 0.65 mg/dL (ref 0.44–1.00)
Glucose, Bld: 82 mg/dL (ref 65–99)
POTASSIUM: 3.9 mmol/L (ref 3.5–5.1)
Sodium: 138 mmol/L (ref 135–145)
TOTAL PROTEIN: 8.3 g/dL — AB (ref 6.5–8.1)

## 2017-09-22 LAB — CBC
HEMATOCRIT: 40.3 % (ref 36.0–46.0)
HEMOGLOBIN: 13.9 g/dL (ref 12.0–15.0)
MCH: 30.1 pg (ref 26.0–34.0)
MCHC: 34.5 g/dL (ref 30.0–36.0)
MCV: 87.2 fL (ref 78.0–100.0)
Platelets: 242 10*3/uL (ref 150–400)
RBC: 4.62 MIL/uL (ref 3.87–5.11)
RDW: 13.8 % (ref 11.5–15.5)
WBC: 5.4 10*3/uL (ref 4.0–10.5)

## 2017-09-22 LAB — I-STAT BETA HCG BLOOD, ED (MC, WL, AP ONLY): I-stat hCG, quantitative: <5 m[IU]/mL (ref <5)

## 2017-09-22 LAB — LIPASE, BLOOD: Lipase: 30 U/L (ref 11–51)

## 2017-09-22 MED ORDER — IOPAMIDOL (ISOVUE-300) INJECTION 61%
100.0000 mL | Freq: Once | INTRAVENOUS | Status: AC | PRN
  Administered 2017-09-22: 80 mL via INTRAVENOUS

## 2017-09-22 MED ORDER — IOPAMIDOL (ISOVUE-300) INJECTION 61%
INTRAVENOUS | Status: AC
  Filled 2017-09-22: qty 100

## 2017-09-22 NOTE — ED Provider Notes (Signed)
[facility]-EMERGENCY DEPT Provider Note   CSN: [id number] Arrival date & time: 09/22/17  0946     History   Chief Complaint Chief Complaint  Patient presents with  . Abdominal Pain  . Anxiety    HPI Durward ParcelSharay Bures is a 27 y.o. female.  27 year old female presents with 1 month history of lower abdominal discomfort.  Was seen in urgent care 3 weeks ago for similar symptoms and diagnosed with constipation.  She currently states that the pain is been persistent and crampy and located in her bilateral lower abdominal quadrants.  No associated dysuria or blood in her urine.  Denies any vaginal bleeding or discharge.  Pain is not affected by eating.  Has had normal bowel movements.  Has not been taking any medications for this currently.  He makes her symptoms better.      Past Medical History:  Diagnosis Date  . GERD (gastroesophageal reflux disease)     There are no active problems to display for this patient.   Past Surgical History:  Procedure Laterality Date  . NO PAST SURGERIES      OB History    Gravida Para Term Preterm AB Living   2 0 0 0 1 0   SAB TAB Ectopic Multiple Live Births   0 1 0 0 0       Home Medications    Prior to Admission medications   Medication Sig Start Date End Date Taking? Authorizing Provider  calamine lotion Apply 1 application topically as needed for itching. Patient not taking: Reported on 05/20/2017 01/20/17   Barrett HenleNadeau, Nicole Elizabeth, PA-C  docusate sodium (COLACE) 50 MG capsule Take 1 capsule (50 mg total) by mouth 2 (two) times daily. 09/07/17   Cathie HoopsYu, Amy V, PA-C  meloxicam (MOBIC) 7.5 MG tablet Take 1 tablet (7.5 mg total) by mouth daily. 09/07/17   Cathie HoopsYu, Amy V, PA-C  polyethylene glycol (MIRALAX) packet Take 17 g by mouth daily. 09/07/17   Belinda FisherYu, Amy V, PA-C    Family History Family History  Problem Relation Age of Onset  . Heart disease Paternal Grandmother   . Cancer Paternal Grandmother     Social  History Social History   Tobacco Use  . Smoking status: Never Smoker  . Smokeless tobacco: Never Used  Substance Use Topics  . Alcohol use: Yes    Alcohol/week: 0.0 oz    Comment: occasionally  . Drug use: No     Allergies   Patient has no known allergies.   Review of Systems Review of Systems  All other systems reviewed and are negative.    Physical Exam Updated Vital Signs BP 118/80   Pulse 93   Temp 97.7 F (36.5 C)   Resp 13   Ht 1.499 m (4\' 11" )   Wt 49.4 kg (109 lb)   LMP 09/17/2016   SpO2 98%   BMI 22.02 kg/m   Physical Exam  Constitutional: She is oriented to person, place, and time. She appears well-developed and well-nourished.  Non-toxic appearance. No distress.  HENT:  Head: Normocephalic and atraumatic.  Eyes: Conjunctivae, EOM and lids are normal. Pupils are equal, round, and reactive to light.  Neck: Normal range of motion. Neck supple. No tracheal deviation present. No thyroid mass present.  Cardiovascular: Normal rate, regular rhythm and normal heart sounds. Exam reveals no gallop.  No murmur heard. Pulmonary/Chest: Effort normal and breath sounds normal. No stridor. No respiratory distress. She has no decreased breath sounds. She has  no wheezes. She has no rhonchi. She has no rales.  Abdominal: Soft. Normal appearance and bowel sounds are normal. She exhibits no distension. There is no tenderness. There is no rebound and no CVA tenderness.    Musculoskeletal: Normal range of motion. She exhibits no edema or tenderness.  Neurological: She is alert and oriented to person, place, and time. She has normal strength. No cranial nerve deficit or sensory deficit. GCS eye subscore is 4. GCS verbal subscore is 5. GCS motor subscore is 6.  Skin: Skin is warm and dry. No abrasion and no rash noted.  Psychiatric: She has a normal mood and affect. Her speech is normal and behavior is normal.  Nursing note and vitals reviewed.    ED Treatments / Results   Labs (all labs ordered are listed, but only abnormal results are displayed) Labs Reviewed  LIPASE, BLOOD  COMPREHENSIVE METABOLIC PANEL  CBC  URINALYSIS, ROUTINE W REFLEX MICROSCOPIC  I-STAT BETA HCG BLOOD, ED ([facility], [facility], [facility] ONLY)    EKG  EKG Interpretation None       Radiology No results found.  Procedures Procedures (including critical care time)  Medications Ordered in ED Medications - No data to display   Initial Impression / Assessment and Plan / ED Course  I have reviewed the triage vital signs and the nursing notes.  Pertinent labs & imaging results that were available during my care of the patient were reviewed by me and considered in my medical decision making (see chart for details).    Labs are without acute abnormalities.  Patient abdominal CT results reviewed with her.  Abdominal exam is without acute findings at this time.  Follow-up with her doctor.  Final Clinical Impressions(s) / ED Diagnoses   Final diagnoses:  None    ED Discharge Orders    None       Lorre Nick, MD 09/22/17 1443

## 2017-09-22 NOTE — ED Triage Notes (Signed)
Patient c/o bilateral lower abdominal pain x 1 month. Patient denies any vaginal discharge. Patient reports that she went to an UC and was given a prescription for Miralax and states she has been having normal BM's, but continues to have abdominal pain. Patient c/o anxiety since she had a miscarriage 10/18 and states anxiety periodically and recently has been happening more frequently.

## 2018-01-07 ENCOUNTER — Inpatient Hospital Stay (HOSPITAL_COMMUNITY)
Admission: AD | Admit: 2018-01-07 | Discharge: 2018-01-07 | Disposition: A | Discharge status: Home / Self Care | Payer: 59 | Source: Ambulatory Visit | Attending: Obstetrics and Gynecology | Admitting: Obstetrics and Gynecology

## 2018-01-07 ENCOUNTER — Encounter (HOSPITAL_COMMUNITY): Payer: Self-pay | Admitting: *Deleted

## 2018-01-07 DIAGNOSIS — K219 Gastro-esophageal reflux disease without esophagitis: Secondary | ICD-10-CM | POA: Insufficient documentation

## 2018-01-07 DIAGNOSIS — Z3202 Encounter for pregnancy test, result negative: Secondary | ICD-10-CM | POA: Diagnosis not present

## 2018-01-07 DIAGNOSIS — R109 Unspecified abdominal pain: Secondary | ICD-10-CM | POA: Diagnosis present

## 2018-01-07 DIAGNOSIS — N946 Dysmenorrhea, unspecified: Secondary | ICD-10-CM | POA: Diagnosis not present

## 2018-01-07 DIAGNOSIS — N939 Abnormal uterine and vaginal bleeding, unspecified: Secondary | ICD-10-CM | POA: Diagnosis not present

## 2018-01-07 LAB — CBC WITH DIFFERENTIAL/PLATELET
Basophils Absolute: 0 10*3/uL (ref 0.0–0.1)
Basophils Relative: 0 %
Eosinophils Absolute: 0.2 10*3/uL (ref 0.0–0.7)
Eosinophils Relative: 2 %
HCT: 40.4 % (ref 36.0–46.0)
HEMOGLOBIN: 13.8 g/dL (ref 12.0–15.0)
LYMPHS ABS: 3 10*3/uL (ref 0.7–4.0)
LYMPHS PCT: 39 %
MCH: 30.3 pg (ref 26.0–34.0)
MCHC: 34.2 g/dL (ref 30.0–36.0)
MCV: 88.6 fL (ref 78.0–100.0)
Monocytes Absolute: 0.5 10*3/uL (ref 0.1–1.0)
Monocytes Relative: 6 %
Neutro Abs: 4.1 10*3/uL (ref 1.7–7.7)
Neutrophils Relative %: 53 %
Platelets: 222 10*3/uL (ref 150–400)
RBC: 4.56 MIL/uL (ref 3.87–5.11)
RDW: 14 % (ref 11.5–15.5)
WBC: 7.8 10*3/uL (ref 4.0–10.5)

## 2018-01-07 LAB — URINALYSIS, ROUTINE W REFLEX MICROSCOPIC
BILIRUBIN URINE: NEGATIVE
Bacteria, UA: NONE SEEN
GLUCOSE, UA: NEGATIVE mg/dL
KETONES UR: NEGATIVE mg/dL
NITRITE: NEGATIVE
PH: 6 (ref 5.0–8.0)
Protein, ur: NEGATIVE mg/dL
Specific Gravity, Urine: 1.018 (ref 1.005–1.030)
WBC, UA: >50 WBC/hpf — ABNORMAL HIGH (ref 0–5)

## 2018-01-07 LAB — HCG, QUANTITATIVE, PREGNANCY: hCG, Beta Chain, Quant, S: <1 m[IU]/mL (ref <5)

## 2018-01-07 LAB — WET PREP, GENITAL
Clue Cells Wet Prep HPF POC: NONE SEEN
SPERM: NONE SEEN
Trich, Wet Prep: NONE SEEN
Yeast Wet Prep HPF POC: NONE SEEN

## 2018-01-07 LAB — POCT PREGNANCY, URINE: Preg Test, Ur: NEGATIVE

## 2018-01-07 MED ORDER — KETOROLAC TROMETHAMINE 10 MG PO TABS
10.0000 mg | ORAL_TABLET | Freq: Once | ORAL | Status: AC
  Administered 2018-01-07: 10 mg via ORAL
  Filled 2018-01-07: qty 1

## 2018-01-07 MED ORDER — TRAMADOL HCL 50 MG PO TABS
100.0000 mg | ORAL_TABLET | Freq: Once | ORAL | Status: AC
  Administered 2018-01-07: 100 mg via ORAL
  Filled 2018-01-07: qty 2

## 2018-01-07 MED ORDER — KETOROLAC TROMETHAMINE 10 MG PO TABS: 10.0000 mg | ORAL_TABLET | Freq: Four times a day (QID) | ORAL | 0 refills | Status: DC | PRN

## 2018-01-07 MED ORDER — KETOROLAC TROMETHAMINE 60 MG/2ML IM SOLN: 60.0000 mg | Freq: Once | INTRAMUSCULAR | Status: DC

## 2018-01-07 NOTE — MAU Note (Signed)
Pt came in by EMS, had pos HPT beginning of May, woke up this morning in a lot of pain, her pants were bloody.  Went to [facility] & passed more blood.

## 2018-01-07 NOTE — Discharge Instructions (Signed)
Pregnancy Test Information What is a pregnancy test? A pregnancy test is used to detect the presence of human chorionic gonadotropin (hCG) in a sample of your urine or blood. hCG is a hormone produced by the cells of the placenta. The placenta is the organ that forms to nourish and support a developing baby. This test requires a sample of either blood or urine. A pregnancy test determines whether you are pregnant or not. How are pregnancy tests done? Pregnancy tests are done using a home pregnancy test or having a blood or urine test done at your health care provider's office. Home pregnancy tests require a urine sample.  Most kits use a plastic testing device with a strip of paper that indicates whether there is hCG in your urine.  Follow the test instructions very carefully.  After you urinate on the test stick, markings will appear to let you know whether you are pregnant.  For best results, use your first urine of the morning. That is when the concentration of hCG is highest.  Having a blood test to check for pregnancy requires a sample of blood drawn from a vein in your hand or arm. Your health care provider will send your sample to a lab for testing. Results of a pregnancy test will be positive or negative. Is one type of pregnancy test better than another? In some cases, a blood test will return a positive result even if a urine test was negative because blood tests are more sensitive. This means blood tests can detect hCG earlier than home pregnancy tests. How accurate are home pregnancy tests? Both types of pregnancy tests are very accurate.  A blood test is about 98% accurate.  When you are far enough along in your pregnancy and when used correctly, home pregnancy tests are equally accurate.  Can anything interfere with home pregnancy test results It is possible for certain conditions to cause an inaccurate test result (false positive or falsenegative).  A false positive is a  positive test result when you are not pregnant. This can happen if you: ? Are taking certain medicines, including anticonvulsants or tranquilizers. ? Have certain proteins in your blood.  A false negative is a negative test result when you are pregnant. This can happen if you: ? Took the test before there was enough hCG to detect. A pregnancy test will not be positive in most women until 3-4 weeks after conception. ? Drank a lot of liquid before the test. Diluted urine samples can sometimes give an inaccurate result. ? Take certain medicines, such as water pills (diuretics) or some antihistamines.  What should I do if I have a positive pregnancy test? If you have a positive pregnancy test, schedule an appointment with your health care provider. You might need additional testing to confirm the pregnancy. In the meantime, begin taking a prenatal vitamin, stop smoking, stop drinking alcohol, and do not use street drugs. Talk to your health care provider about how to take care of yourself during your pregnancy. Ask about what to expect from the care you will need throughout pregnancy (prenatal care). This information is not intended to replace advice given to you by your health care provider. Make sure you discuss any questions you have with your health care provider. Document Released: 07/31/2003 Document Revised: 06/24/2016 Document Reviewed: 11/22/2013 Elsevier Interactive Patient Education  2018 [employer].  Dysmenorrhea Menstrual cramps (dysmenorrhea) are caused by the muscles of the uterus tightening (contracting) during a menstrual period. For some women,  this discomfort is merely bothersome. For others, dysmenorrhea can be severe enough to interfere with everyday activities for a few days each month. Primary dysmenorrhea is menstrual cramps that last a couple of days when you start having menstrual periods or soon after. This often begins after a teenager starts having her period. As a woman  gets older or has a baby, the cramps will usually lessen or disappear. Secondary dysmenorrhea begins later in life, lasts longer, and the pain may be stronger than primary dysmenorrhea. The pain may start before the period and last a few days after the period. What are the causes? Dysmenorrhea is usually caused by an underlying problem, such as:  The tissue lining the uterus grows outside of the uterus in other areas of the body (endometriosis).  The endometrial tissue, which normally lines the uterus, is found in or grows into the muscular walls of the uterus (adenomyosis).  The pelvic blood vessels are engorged with blood just before the menstrual period (pelvic congestive syndrome).  Overgrowth of cells (polyps) in the lining of the uterus or cervix.  Falling down of the uterus (prolapse) because of loose or stretched ligaments.  Depression.  Bladder problems, infection, or inflammation.  Problems with the intestine, a tumor, or irritable bowel syndrome.  Cancer of the female organs or bladder.  A severely tipped uterus.  A very tight opening or closed cervix.  Noncancerous tumors of the uterus (fibroids).  Pelvic inflammatory disease (PID).  Pelvic scarring (adhesions) from a previous surgery.  Ovarian cyst.  An intrauterine device (IUD) used for birth control.  What increases the risk? You may be at greater risk of dysmenorrhea if:  You are younger than age 28.  You started puberty early.  You have irregular or heavy bleeding.  You have never given birth.  You have a family history of this problem.  You are a smoker.  What are the signs or symptoms?  Cramping or throbbing pain in your lower abdomen.  Headaches.  Lower back pain.  Nausea or vomiting.  Diarrhea.  Sweating or dizziness.  Loose stools. How is this diagnosed? A diagnosis is based on your history, symptoms, physical exam, diagnostic tests, or procedures. Diagnostic tests or procedures  may include:  Blood tests.  Ultrasonography.  An examination of the lining of the uterus (dilation and curettage, D&C).  An examination inside your abdomen or pelvis with a scope (laparoscopy).  X-rays.  CT scan.  MRI.  An examination inside the bladder with a scope (cystoscopy).  An examination inside the intestine or stomach with a scope (colonoscopy, gastroscopy).  How is this treated? Treatment depends on the cause of the dysmenorrhea. Treatment may include:  Pain medicine prescribed by your health care provider.  Birth control pills or an IUD with progesterone hormone in it.  Hormone replacement therapy.  Nonsteroidal anti-inflammatory drugs (NSAIDs). These may help stop the production of prostaglandins.  Surgery to remove adhesions, endometriosis, ovarian cyst, or fibroids.  Removal of the uterus (hysterectomy).  Progesterone shots to stop the menstrual period.  Cutting the nerves on the sacrum that go to the female organs (presacral neurectomy).  Electric current to the sacral nerves (sacral nerve stimulation).  Antidepressant medicine.  Psychiatric therapy, counseling, or group therapy.  Exercise and physical therapy.  Meditation and yoga therapy.  Acupuncture.  Follow these instructions at home:  Only take over-the-counter or prescription medicines as directed by your health care provider.  Place a heating pad or hot water bottle on your  lower back or abdomen. Do not sleep with the heating pad.  Use aerobic exercises, walking, swimming, biking, and other exercises to help lessen the cramping.  Massage to the lower back or abdomen may help.  Stop smoking.  Avoid alcohol and caffeine. Contact a health care provider if:  Your pain does not get better with medicine.  You have pain with sexual intercourse.  Your pain increases and is not controlled with medicines.  You have abnormal vaginal bleeding with your period.  You develop nausea or  vomiting with your period that is not controlled with medicine. Get help right away if: You pass out. This information is not intended to replace advice given to you by your health care provider. Make sure you discuss any questions you have with your health care provider. Document Released: 07/28/2005 Document Revised: 01/03/2016 Document Reviewed: 01/13/2013 Elsevier Interactive Patient Education  2017 [employer].

## 2018-01-07 NOTE — MAU Provider Note (Signed)
Chief Complaint: Abdominal Pain and Vaginal Bleeding   First Provider Initiated Contact with Patient 01/07/18 1035     SUBJECTIVE HPI: Monica Armstrong is a 27 y.o. G2P0020 female who presents to [facility] reporting pos home UPT x 3 in early May and heavy bleeding and cramping since this morning. Has not had any testing since home UPTs. Has appt at [facility] 01/21/18 for ultrasound. Patient's last menstrual period was 11/29/2017. Has has irreg cycles the past few months.   Location: Low abdomen/pelvis Quality: Cramping Severity: 10/10 on pain scale Duration: Several hours Context: Positive home UPT Timing: Intermittent Modifying factors: Nothing.  Has not tried anything to treat the pain. Associated signs and symptoms: Negative for fever, chills, nausea, vomiting, diarrhea, constipation, urinary complaints, dizziness, vaginal discharge.  Positive for vaginal bleeding passing clots.  Denies passing tissue.  Past Medical History:  Diagnosis Date  . GERD (gastroesophageal reflux disease)    OB History  Gravida Para Term Preterm AB Living  2 0 0 0 2 0  SAB TAB Ectopic Multiple Live Births  1 1 0 0 0    # Outcome Date GA Lbr Len/2nd Weight Sex Delivery Anes PTL Lv  2 SAB           1 TAB            Past Surgical History:  Procedure Laterality Date  . NO PAST SURGERIES     Social History   Socioeconomic History  . Marital status: Single    Spouse name: Not on file  . Number of children: Not on file  . Years of education: Not on file  . Highest education level: Not on file  Occupational History  . Not on file  Social Needs  . Financial resource strain: Not on file  . Food insecurity:    Worry: Not on file    Inability: Not on file  . Transportation needs:    Medical: Not on file    Non-medical: Not on file  Tobacco Use  . Smoking status: Never Smoker  . Smokeless tobacco: Never Used  Substance and Sexual Activity  . Alcohol use: Not Currently     Alcohol/week: 0.0 oz    Comment: occasionally  . Drug use: No  . Sexual activity: Yes  Lifestyle  . Physical activity:    Days per week: Not on file    Minutes per session: Not on file  . Stress: Not on file  Relationships  . Social connections:    Talks on phone: Not on file    Gets together: Not on file    Attends religious service: Not on file    Active member of club or organization: Not on file    Attends meetings of clubs or organizations: Not on file    Relationship status: Not on file  . Intimate partner violence:    Fear of current or ex partner: Not on file    Emotionally abused: Not on file    Physically abused: Not on file    Forced sexual activity: Not on file  Other Topics Concern  . Not on file  Social History Narrative  . Not on file   Family History  Problem Relation Age of Onset  . Heart disease Paternal Grandmother   . Cancer Paternal Grandmother    No current facility-administered medications on file prior to encounter.    Current Outpatient Medications on File Prior to Encounter  Medication Sig Dispense Refill  . calamine  lotion Apply 1 application topically as needed for itching. (Patient not taking: Reported on 05/20/2017) 120 mL 0  . docusate sodium (COLACE) 50 MG capsule Take 1 capsule (50 mg total) by mouth 2 (two) times daily. (Patient not taking: Reported on 09/22/2017) 10 capsule 0  . meloxicam (MOBIC) 7.5 MG tablet Take 1 tablet (7.5 mg total) by mouth daily. (Patient not taking: Reported on 09/22/2017) 15 tablet 0  . polyethylene glycol (MIRALAX) packet Take 17 g by mouth daily. 14 each 0   No Known Allergies  I have reviewed patient's Past Medical Hx, Surgical Hx, Family Hx, Social Hx, medications and allergies.   Review of Systems  Constitutional: Negative for chills and fever.  Gastrointestinal: Positive for abdominal pain. Negative for abdominal distention, constipation, diarrhea, nausea and vomiting.  Genitourinary: Positive for pelvic  pain and vaginal bleeding. Negative for difficulty urinating, dysuria, flank pain, frequency, hematuria, urgency and vaginal discharge.  Musculoskeletal: Negative for back pain.  Skin: Negative for pallor.  Neurological: Negative for dizziness.    OBJECTIVE Patient Vitals for the past 24 hrs:  BP Temp Temp src Pulse Resp  01/07/18 1331 125/73 - - 63 18  01/07/18 1021 110/73 98.4 F (36.9 C) Oral 61 16   Constitutional: Well-developed, well-nourished female in moderate distress.  Cardiovascular: normal rate Respiratory: normal rate and effort.  GI: Abd soft, non-tender, Pos BS x 4 MS: Extremities nontender, no edema, normal ROM Neurologic: Alert and oriented x 4.  GU: Neg CVAT.  SPECULUM EXAM: NEFG, physiologic discharge, small amount of bright red blood noted, cervix clean  BIMANUAL: cervix closed; uterus not enlarged, no adnexal tenderness or masses.  Mild CMT.  LAB RESULTS Results for orders placed or performed during the hospital encounter of 01/07/18 (from the past 24 hour(s))  Urinalysis, Routine w reflex microscopic     Status: Abnormal   Collection Time: 01/07/18 10:15 AM  Result Value Ref Range   Color, Urine YELLOW YELLOW   APPearance HAZY (A) CLEAR   Specific Gravity, Urine 1.018 1.005 - 1.030   pH 6.0 5.0 - 8.0   Glucose, UA NEGATIVE NEGATIVE mg/dL   Hgb urine dipstick LARGE (A) NEGATIVE   Bilirubin Urine NEGATIVE NEGATIVE   Ketones, ur NEGATIVE NEGATIVE mg/dL   Protein, ur NEGATIVE NEGATIVE mg/dL   Nitrite NEGATIVE NEGATIVE   Leukocytes, UA SMALL (A) NEGATIVE   RBC / HPF >50 (H) 0 - 5 RBC/hpf   WBC, UA >50 (H) 0 - 5 WBC/hpf   Bacteria, UA NONE SEEN NONE SEEN   Squamous Epithelial / LPF 0-5 0 - 5   Mucus PRESENT   Pregnancy, urine POC     Status: None   Collection Time: 01/07/18 10:25 AM  Result Value Ref Range   Preg Test, Ur NEGATIVE NEGATIVE  hCG, quantitative, pregnancy     Status: None   Collection Time: 01/07/18 10:54 AM  Result Value Ref Range    hCG, Beta Chain, Quant, S <1 <5 mIU/mL  CBC with Differential/Platelet     Status: None   Collection Time: 01/07/18 10:54 AM  Result Value Ref Range   WBC 7.8 4.0 - 10.5 K/uL   RBC 4.56 3.87 - 5.11 MIL/uL   Hemoglobin 13.8 12.0 - 15.0 g/dL   HCT 16.1 09.6 - 04.5 %   MCV 88.6 78.0 - 100.0 fL   MCH 30.3 26.0 - 34.0 pg   MCHC 34.2 30.0 - 36.0 g/dL   RDW 40.9 81.1 - 91.4 %   Platelets  222 150 - 400 K/uL   Neutrophils Relative % 53 %   Neutro Abs 4.1 1.7 - 7.7 K/uL   Lymphocytes Relative 39 %   Lymphs Abs 3.0 0.7 - 4.0 K/uL   Monocytes Relative 6 %   Monocytes Absolute 0.5 0.1 - 1.0 K/uL   Eosinophils Relative 2 %   Eosinophils Absolute 0.2 0.0 - 0.7 K/uL   Basophils Relative 0 %   Basophils Absolute 0.0 0.0 - 0.1 K/uL  Wet prep, genital     Status: Abnormal   Collection Time: 01/07/18 12:40 PM  Result Value Ref Range   Yeast Wet Prep HPF POC NONE SEEN NONE SEEN   Trich, Wet Prep NONE SEEN NONE SEEN   Clue Cells Wet Prep HPF POC NONE SEEN NONE SEEN   WBC, Wet Prep HPF POC FEW (A) NONE SEEN   Sperm NONE SEEN     IMAGING No results found.  MAU COURSE Orders Placed This Encounter  Procedures  . Wet prep, genital  . Urine Culture  . Urinalysis, Routine w reflex microscopic  . hCG, quantitative, pregnancy  . CBC with Differential/Platelet  . HIV antibody (routine testing) (NOT for [facility])  . Pregnancy, urine POC  . Discharge patient   Meds ordered this encounter  Medications  . traMADol (ULTRAM) tablet 100 mg  . DISCONTD: ketorolac (TORADOL) injection 60 mg  . ketorolac (TORADOL) tablet 10 mg  . ketorolac (TORADOL) 10 MG tablet    Sig: Take 1 tablet (10 mg total) by mouth every 6 (six) hours as needed.    Dispense:  20 tablet    Refill:  0    Order Specific Question:   Supervising Provider    Answer:   [place] Bing [[id number]]   Pain no better after Ultram.  Toradol given. Pain improved.  Discussed Hx, labs, exam w/ Dr. Redmond Pulling w/ POC. New orders: None.    MDM -Cramping and vaginal bleeding with negative urine and serum pregnancy test and MAU, but history of positive home UPT x3.  Explained to patient that she could have had a very early miscarriage or could have had a false positive pregnancy test at home and onset of menses w/ dysmenorrhea.  Bleeding, H/H and vital signs stable.   ASSESSMENT 1. Abnormal uterine bleeding (AUB)   2. Pregnancy examination or test, negative result   3. Dysmenorrhea     PLAN Discharge home in stable condition. Bleeding precautions Rx Toradol for pain Follow-up Information    Zelphia Cairo, MD Follow up.   Specialty:  Obstetrics and Gynecology Why:  as scheduled. Call office to change to Gynecology appointment Contact information: [address] [place], [address] [place] [address] [phone]        THE [facility] [place] MATERNITY ADMISSIONS Follow up.   Why:  as needed and gynecology emergencies Contact information: [address] [id number] mc [place] [address] [phone]         Allergies as of 01/07/2018   No Known Allergies     Medication List    STOP taking these medications   calamine lotion   docusate sodium 50 MG capsule Commonly known as:  COLACE   meloxicam 7.5 MG tablet Commonly known as:  MOBIC     TAKE these medications   ketorolac 10 MG tablet Commonly known as:  TORADOL Take 1 tablet (10 mg total) by mouth every 6 (six) hours as needed.   polyethylene glycol packet Commonly known as:  MIRALAX Take  17 g by mouth daily.        Katrinka Blazing, [place], CNM 01/07/2018  1:22 PM

## 2018-01-08 LAB — URINE CULTURE
Culture: <10000 — AB
Special Requests: NORMAL

## 2018-01-08 LAB — GC/CHLAMYDIA PROBE AMP (~~LOC~~) NOT AT ARMC
CHLAMYDIA, DNA PROBE: NEGATIVE
Neisseria Gonorrhea: NEGATIVE

## 2018-01-08 LAB — HIV ANTIBODY (ROUTINE TESTING W REFLEX): HIV Screen 4th Generation wRfx: NONREACTIVE

## 2018-01-14 ENCOUNTER — Encounter (HOSPITAL_COMMUNITY): Payer: Self-pay | Admitting: Emergency Medicine

## 2018-01-14 ENCOUNTER — Emergency Department (HOSPITAL_COMMUNITY): Payer: 59

## 2018-01-14 ENCOUNTER — Emergency Department (HOSPITAL_COMMUNITY)
Admission: EM | Admit: 2018-01-14 | Discharge: 2018-01-14 | Disposition: A | Discharge status: Home / Self Care | Payer: 59 | Attending: Physician Assistant | Admitting: Physician Assistant

## 2018-01-14 DIAGNOSIS — R109 Unspecified abdominal pain: Secondary | ICD-10-CM

## 2018-01-14 DIAGNOSIS — R1012 Left upper quadrant pain: Secondary | ICD-10-CM | POA: Insufficient documentation

## 2018-01-14 LAB — CBC
HCT: 42.7 % (ref 36.0–46.0)
Hemoglobin: 14.4 g/dL (ref 12.0–15.0)
MCH: 29.8 pg (ref 26.0–34.0)
MCHC: 33.7 g/dL (ref 30.0–36.0)
MCV: 88.2 fL (ref 78.0–100.0)
Platelets: 323 10*3/uL (ref 150–400)
RBC: 4.84 MIL/uL (ref 3.87–5.11)
RDW: 13.8 % (ref 11.5–15.5)
WBC: 6.3 10*3/uL (ref 4.0–10.5)

## 2018-01-14 LAB — I-STAT BETA HCG BLOOD, ED (MC, WL, AP ONLY)

## 2018-01-14 LAB — COMPREHENSIVE METABOLIC PANEL
ALK PHOS: 72 U/L (ref 38–126)
ALT: 19 U/L (ref 14–54)
ANION GAP: 8 (ref 5–15)
AST: 18 U/L (ref 15–41)
Albumin: 4 g/dL (ref 3.5–5.0)
BILIRUBIN TOTAL: 0.5 mg/dL (ref 0.3–1.2)
BUN: 8 mg/dL (ref 6–20)
CALCIUM: 9.3 mg/dL (ref 8.9–10.3)
CO2: 28 mmol/L (ref 22–32)
Chloride: 106 mmol/L (ref 101–111)
Creatinine, Ser: 0.72 mg/dL (ref 0.44–1.00)
GFR calc Af Amer: >60 mL/min (ref >60)
GFR calc non Af Amer: >60 mL/min (ref >60)
GLUCOSE: 90 mg/dL (ref 65–99)
Potassium: 3.9 mmol/L (ref 3.5–5.1)
Sodium: 142 mmol/L (ref 135–145)
TOTAL PROTEIN: 8.1 g/dL (ref 6.5–8.1)

## 2018-01-14 LAB — LIPASE, BLOOD: Lipase: 38 U/L (ref 11–51)

## 2018-01-14 MED ORDER — DOCUSATE SODIUM 100 MG PO CAPS: 100.0000 mg | ORAL_CAPSULE | Freq: Two times a day (BID) | ORAL | 0 refills | Status: DC

## 2018-01-14 MED ORDER — IOPAMIDOL (ISOVUE-300) INJECTION 61%
INTRAVENOUS | Status: AC
  Filled 2018-01-14: qty 100

## 2018-01-14 MED ORDER — IOPAMIDOL (ISOVUE-300) INJECTION 61%
100.0000 mL | Freq: Once | INTRAVENOUS | Status: AC | PRN
  Administered 2018-01-14: 100 mL via INTRAVENOUS

## 2018-01-14 NOTE — ED Notes (Signed)
Patient transported to CT 

## 2018-01-14 NOTE — ED Provider Notes (Signed)
[facility]-EMERGENCY DEPT Provider Note   CSN: [id number] Arrival date & time: 01/14/18  4098     History   Chief Complaint Chief Complaint  Patient presents with  . Abdominal Pain    HPI Monica Armstrong is a 27 y.o. female.  HPI  Patient is a 27 year old female presenting with left-sided abdominal pain.  Patient reports that 3 weeks ago she took several positive pregnancy test.  She went to women's because she started to bleed.  She took an EMS vehicle from her job to [employer] to evaluate.  They reported that all of her hormone levels were negative and that she was likely had an earlier miscarriage.  She is here today because 6 days later she now has a little bit of left upper quadrant pain.  She has no nausea no vomiting.  No further bleeding.  No headaches no other symptoms.  She reports mild crampy pain in the left side.  Past Medical History:  Diagnosis Date  . GERD (gastroesophageal reflux disease)     There are no active problems to display for this patient.   Past Surgical History:  Procedure Laterality Date  . NO PAST SURGERIES       OB History    Gravida  2   Para  0   Term  0   Preterm  0   AB  2   Living  0     SAB  1   TAB  1   Ectopic  0   Multiple  0   Live Births  0            Home Medications    Prior to Admission medications   Medication Sig Start Date End Date Taking? Authorizing Provider  docusate sodium (COLACE) 100 MG capsule Take 1 capsule (100 mg total) by mouth every 12 (twelve) hours. 01/14/18   Kylieann Eagles Lyn, MD  ketorolac (TORADOL) 10 MG tablet Take 1 tablet (10 mg total) by mouth every 6 (six) hours as needed. Patient not taking: Reported on 01/14/2018 01/07/18   Katrinka Blazing, [place], CNM  polyethylene glycol [facility]) packet Take 17 g by mouth daily. Patient not taking: Reported on 01/14/2018 09/07/17   Lurline Idol    Family History Family History  Problem Relation Age of Onset  . Heart  disease Paternal Grandmother   . Cancer Paternal Grandmother     Social History Social History   Tobacco Use  . Smoking status: Never Smoker  . Smokeless tobacco: Never Used  Substance Use Topics  . Alcohol use: Not Currently    Alcohol/week: 0.0 oz    Comment: occasionally  . Drug use: No     Allergies   Patient has no known allergies.   Review of Systems Review of Systems  Constitutional: Negative for activity change, fatigue and fever.  Respiratory: Negative for shortness of breath.   Cardiovascular: Negative for chest pain.  Gastrointestinal: Positive for abdominal pain. Negative for diarrhea, nausea and vomiting.  Genitourinary: Negative for dysuria and urgency.  All other systems reviewed and are negative.    Physical Exam Updated Vital Signs BP 93/62   Pulse 71   Temp 98.5 F (36.9 C) (Oral)   Resp 20   LMP 01/07/2018 Comment: neg hcg  SpO2 100%   Physical Exam  Constitutional: She is oriented to person, place, and time. She appears well-developed and well-nourished.  HENT:  Head: Normocephalic and atraumatic.  Eyes: Conjunctivae are normal. Right eye  exhibits no discharge.  Neck: Neck supple.  Cardiovascular: Normal rate, regular rhythm and normal heart sounds.  No murmur heard. Pulmonary/Chest: Effort normal and breath sounds normal. She has no wheezes. She has no rales.  Abdominal: Soft. She exhibits no distension. There is generalized tenderness and tenderness in the left upper quadrant.  Musculoskeletal: Normal range of motion. She exhibits no edema.  Neurological: She is oriented to person, place, and time. No cranial nerve deficit.  Skin: Skin is warm and dry. No rash noted. She is not diaphoretic.  Psychiatric: She has a normal mood and affect. Her behavior is normal.  Nursing note and vitals reviewed.    ED Treatments / Results  Labs (all labs ordered are listed, but only abnormal results are displayed) Labs Reviewed  LIPASE, BLOOD    COMPREHENSIVE METABOLIC PANEL  CBC  I-STAT BETA HCG BLOOD, ED ([facility], [facility], [facility] ONLY)    EKG None  Radiology US Transvaginal Non-ob  Result Date: 01/14/2018 CLINICAL DATA:  Patient with left lower quadrant abdominal pain for 1 week. EXAM: TRANSABDOMINAL AND TRANSVAGINAL ULTRASOUND OF PELVIS DOPPLER ULTRASOUND OF OVARIES TECHNIQUE: Both transabdominal and transvaginal ultrasound examinations of the pelvis were performed. Transabdominal technique was performed for global imaging of the pelvis including uterus, ovaries, adnexal regions, and pelvic cul-de-sac. It was necessary to proceed with endovaginal exam following the transabdominal exam to visualize the adnexal structures. Color and duplex Doppler ultrasound was utilized to evaluate blood flow to the ovaries. COMPARISON:  CT abdomen pelvis 01/14/2018 FINDINGS: Uterus Measurements: 6.8 x 3.4 x 4.5 cm. No fibroids or other mass visualized. Endometrium Thickness: 5 mm.  No focal abnormality visualized. Right ovary Measurements: 3.2 x 2.1 x 2.0 cm. Normal appearance/no adnexal mass. Left ovary Measurements: 3.2 x 2.0 x 1.7 cm. Normal appearance/no adnexal mass. Pulsed Doppler evaluation of both ovaries demonstrates normal low-resistance arterial and venous waveforms. Other findings No abnormal free fluid. IMPRESSION: No acute process within the pelvis. Electronically Signed   By: Annia Belt M.D.   On: 01/14/2018 12:58   US Pelvis Complete  Result Date: 01/14/2018 CLINICAL DATA:  Patient with left lower quadrant abdominal pain for 1 week. EXAM: TRANSABDOMINAL AND TRANSVAGINAL ULTRASOUND OF PELVIS DOPPLER ULTRASOUND OF OVARIES TECHNIQUE: Both transabdominal and transvaginal ultrasound examinations of the pelvis were performed. Transabdominal technique was performed for global imaging of the pelvis including uterus, ovaries, adnexal regions, and pelvic cul-de-sac. It was necessary to proceed with endovaginal exam following the transabdominal exam to visualize  the adnexal structures. Color and duplex Doppler ultrasound was utilized to evaluate blood flow to the ovaries. COMPARISON:  CT abdomen pelvis 01/14/2018 FINDINGS: Uterus Measurements: 6.8 x 3.4 x 4.5 cm. No fibroids or other mass visualized. Endometrium Thickness: 5 mm.  No focal abnormality visualized. Right ovary Measurements: 3.2 x 2.1 x 2.0 cm. Normal appearance/no adnexal mass. Left ovary Measurements: 3.2 x 2.0 x 1.7 cm. Normal appearance/no adnexal mass. Pulsed Doppler evaluation of both ovaries demonstrates normal low-resistance arterial and venous waveforms. Other findings No abnormal free fluid. IMPRESSION: No acute process within the pelvis. Electronically Signed   By: Annia Belt M.D.   On: 01/14/2018 12:58   Ct Abdomen Pelvis W Contrast  Result Date: 01/14/2018 CLINICAL DATA:  Left abdominal pain EXAM: CT ABDOMEN AND PELVIS WITH CONTRAST TECHNIQUE: Multidetector CT imaging of the abdomen and pelvis was performed using the standard protocol following bolus administration of intravenous contrast. CONTRAST:  ISOVUE-300 IOPAMIDOL (ISOVUE-300) INJECTION 61% COMPARISON:  CT 09/22/2017 FINDINGS: Lower chest:  Lung bases are clear. No effusions. Heart is normal size. Hepatobiliary: No focal hepatic abnormality. Gallbladder unremarkable. Pancreas: No focal abnormality or ductal dilatation. Spleen: No focal abnormality.  Normal size. Adrenals/Urinary Tract: No adrenal abnormality. No focal renal abnormality. No stones or hydronephrosis. Urinary bladder is unremarkable. Stomach/Bowel: Moderate stool burden throughout the colon. Normal appendix. Stomach, large and small bowel grossly unremarkable. Vascular/Lymphatic: No evidence of aneurysm or adenopathy. Reproductive: Uterus and adnexa unremarkable.  No mass. Other: No free fluid or free air. Musculoskeletal: No acute bony abnormality. IMPRESSION: Moderate stool burden throughout the colon. No acute findings in the abdomen or pelvis. Electronically Signed    By: Charlett NoseKevin  Dover M.D.   On: 01/14/2018 10:58   [place] Art/ven Flow Abd Pelv Doppler  Result Date: 01/14/2018 CLINICAL DATA:  Patient with left lower quadrant abdominal pain for 1 week. EXAM: TRANSABDOMINAL AND TRANSVAGINAL ULTRASOUND OF PELVIS DOPPLER ULTRASOUND OF OVARIES TECHNIQUE: Both transabdominal and transvaginal ultrasound examinations of the pelvis were performed. Transabdominal technique was performed for global imaging of the pelvis including uterus, ovaries, adnexal regions, and pelvic cul-de-sac. It was necessary to proceed with endovaginal exam following the transabdominal exam to visualize the adnexal structures. Color and duplex Doppler ultrasound was utilized to evaluate blood flow to the ovaries. COMPARISON:  CT abdomen pelvis 01/14/2018 FINDINGS: Uterus Measurements: 6.8 x 3.4 x 4.5 cm. No fibroids or other mass visualized. Endometrium Thickness: 5 mm.  No focal abnormality visualized. Right ovary Measurements: 3.2 x 2.1 x 2.0 cm. Normal appearance/no adnexal mass. Left ovary Measurements: 3.2 x 2.0 x 1.7 cm. Normal appearance/no adnexal mass. Pulsed Doppler evaluation of both ovaries demonstrates normal low-resistance arterial and venous waveforms. Other findings No abnormal free fluid. IMPRESSION: No acute process within the pelvis. Electronically Signed   By: Annia Beltrew  Davis M.D.   On: 01/14/2018 12:58    Procedures Procedures (including critical care time)  Medications Ordered in ED Medications  iopamidol (ISOVUE-300) 61 % injection 100 mL (100 mLs Intravenous Contrast Given 01/14/18 1035)     Initial Impression / Assessment and Plan / ED Course  I have reviewed the triage vital signs and the nursing notes.  Pertinent labs & imaging results that were available during my care of the patient were reviewed by me and considered in my medical decision making (see chart for details).     Patient is a 27 year old female presenting with left-sided abdominal pain.  Patient reports that 3  weeks ago she took several positive pregnancy test.  She went to women's because she started to bleed.  [facility].  They reported that all of her hormone levels were negative and that she was likely had an earlier miscarriage.  She is here today because 6 days later she now has a little bit of left upper quadrant pain.  She has no nausea no vomiting.  No further bleeding.  No headaches no other symptoms.  She reports mild crampy pain in the left side.  10:53 AM Patient really only has mild tenderness on exam.  Will do CT given her on history, consider doing transvaginal ultrasound as well.  However with negative hCG, and a negative hCG at the time that she saw women's, less likely to be related to uterus.   CT abdomen pelvis normal.  Labs normal.  Vital signs remain normal.  Transvaginal ultrasound normal.  I think she is probably has gas from constipation.  Discussed with patient.  Will discharge  home.  Patient taking p.o., ambulatory and appears well at time of discharge.  Final Clinical Impressions(s) / ED Diagnoses   Final diagnoses:  Abdominal pain, unspecified abdominal location    ED Discharge Orders        Ordered    docusate sodium (COLACE) 100 MG capsule  Every 12 hours     01/14/18 1349       Kalisha Keadle Lyn, MD 01/15/18 1447

## 2018-01-14 NOTE — ED Triage Notes (Signed)
Pt reports left side abd pains for week with some nausea. Denies, n/d or urinary problems

## 2018-01-14 NOTE — Discharge Instructions (Signed)
Your lab work CAT scan and ultrasound were all reassuring.  Your vital signs remained normal.  We think that your pain could be due to constipation.  Recommend that you take a daily medication to help soften your stools.  Please return as needed.

## 2018-05-15 IMAGING — US US OB COMP LESS 14 WK
1 series · 15 of 28 positions shown · non-contrast
Comparison: No prior scans from this gestation. 03/13/2015 pelvic
sonogram.

CLINICAL DATA: 26-year-old pregnant female presents with mid
abdominal pain and vaginal spotting today.

EDC by LMP: 12/28/2017, projecting to an expected gestational age of
8 weeks 2 days.
EXAM:
OBSTETRIC <14 WK US AND TRANSVAGINAL OB US
TECHNIQUE: Both transabdominal and transvaginal ultrasound examinations were
performed for complete evaluation of the gestation as well as the
maternal uterus, adnexal regions, and pelvic cul-de-sac.
Transvaginal technique was performed to assess early pregnancy.

[Series 1: us ob comp less 14 wk · 15 of 61 slices shown]
[im 1/61]
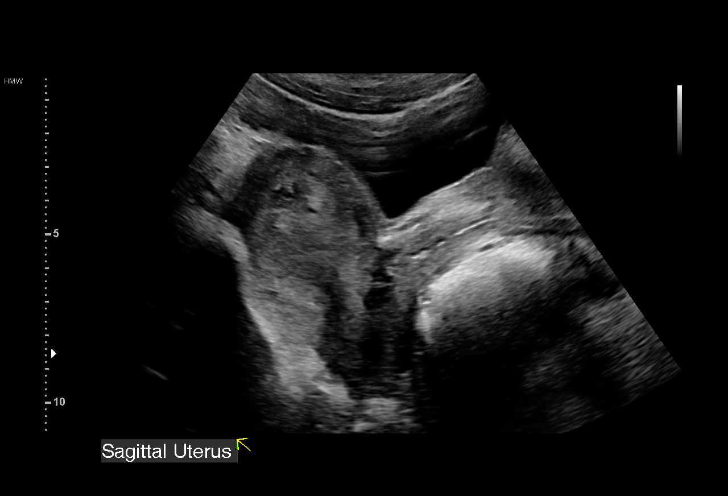
[im 5/61]
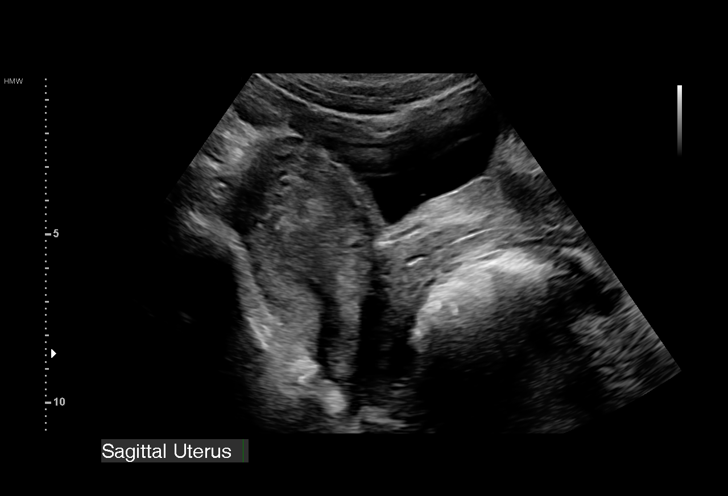
[im 9/61]
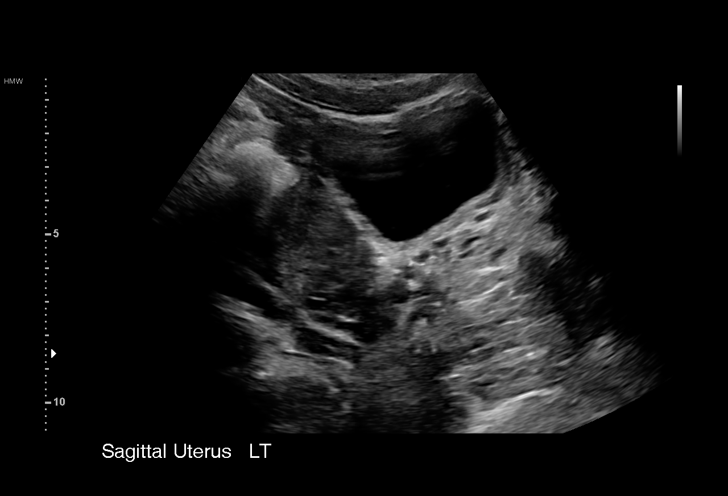
[im 14/61]
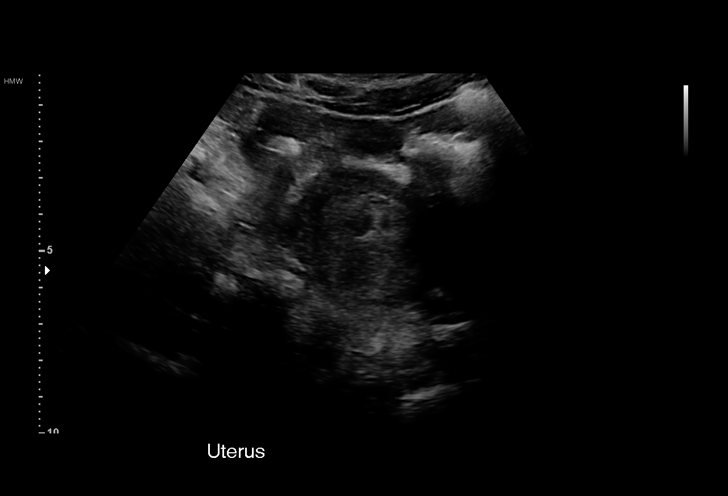
[im 18/61]
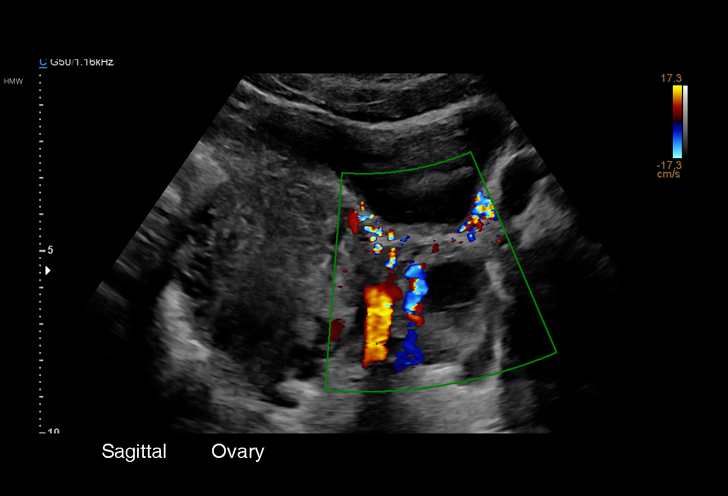
[im 23/61]
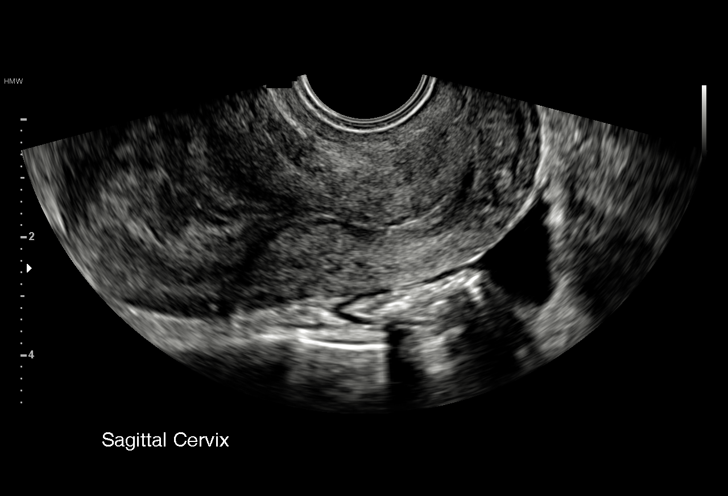
[im 27/61]
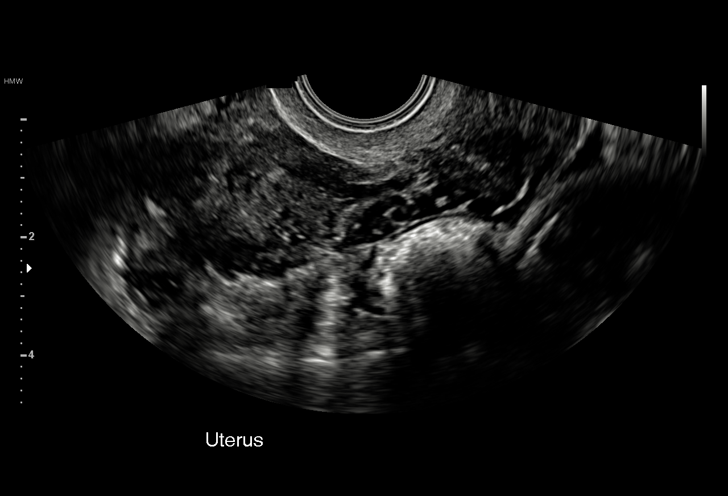
[im 32/61]
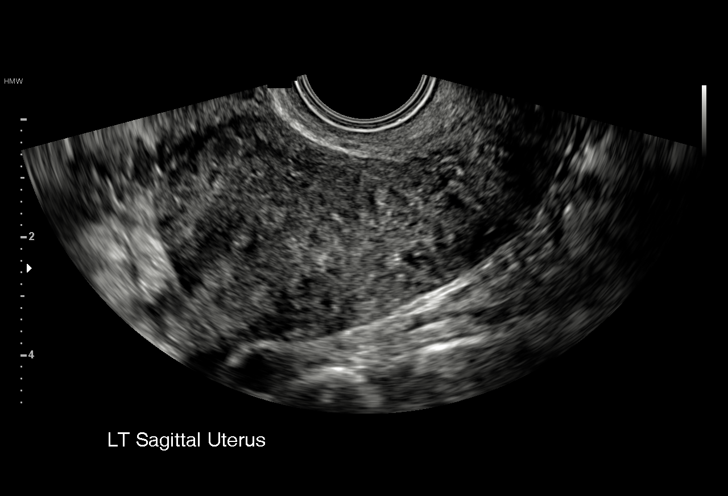
[im 34/61]
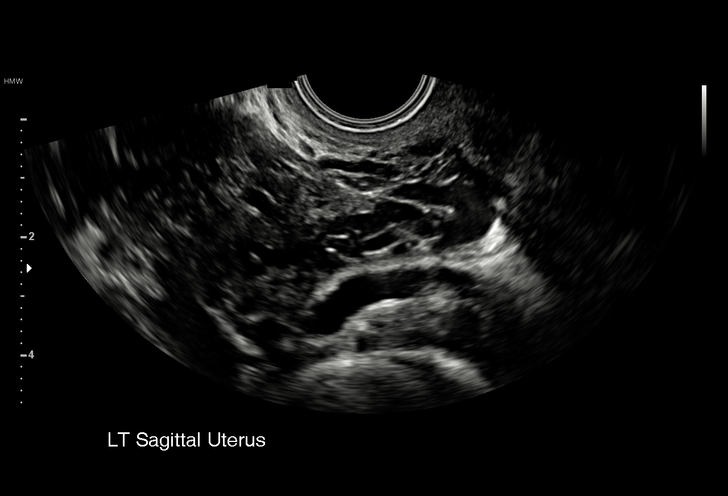
[im 38/61]
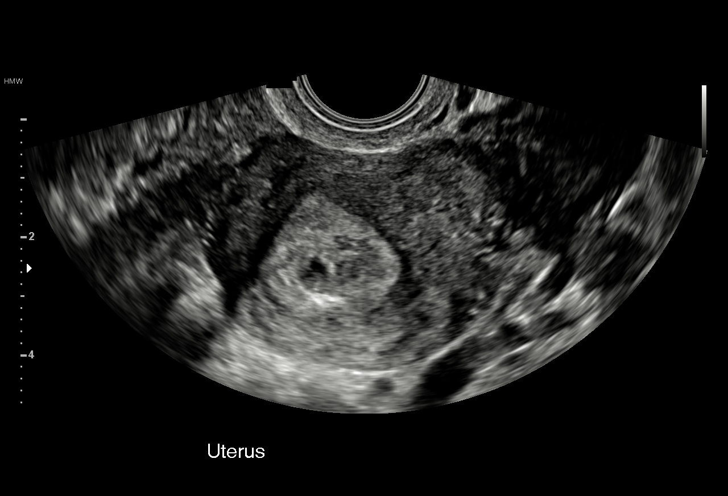
[im 43/61]
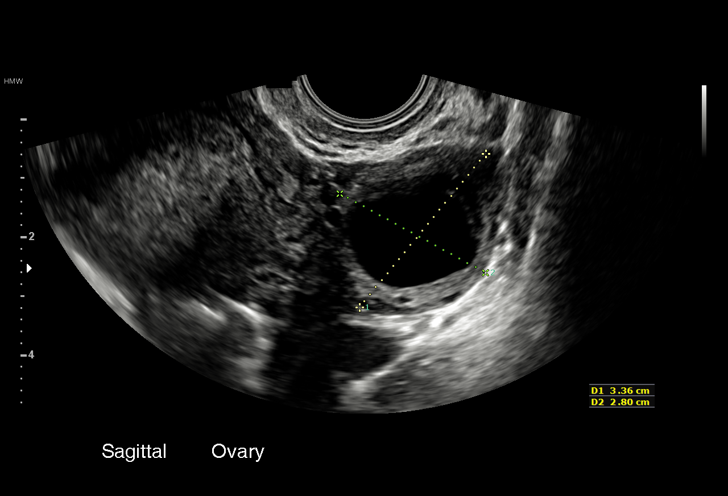
[im 47/61]
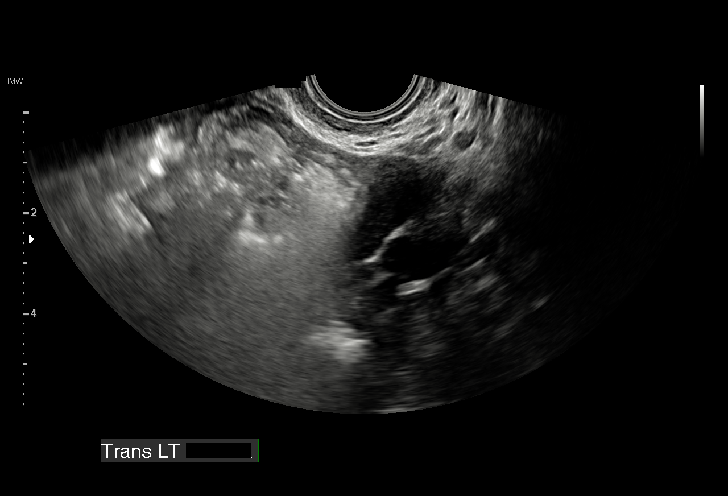
[im 52/61]
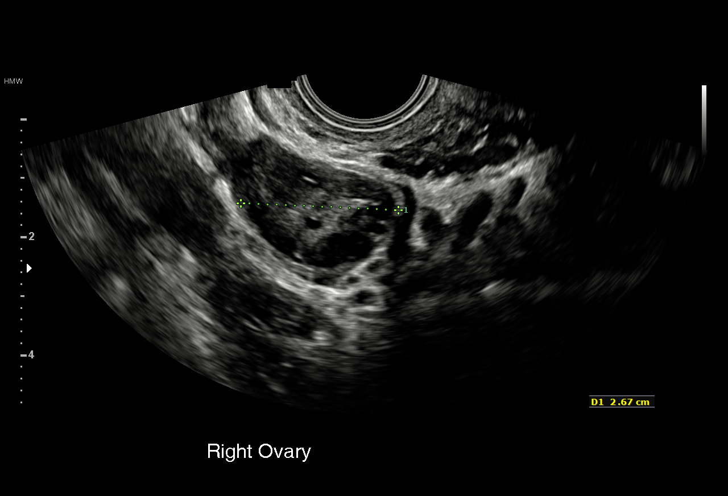
[im 56/61]
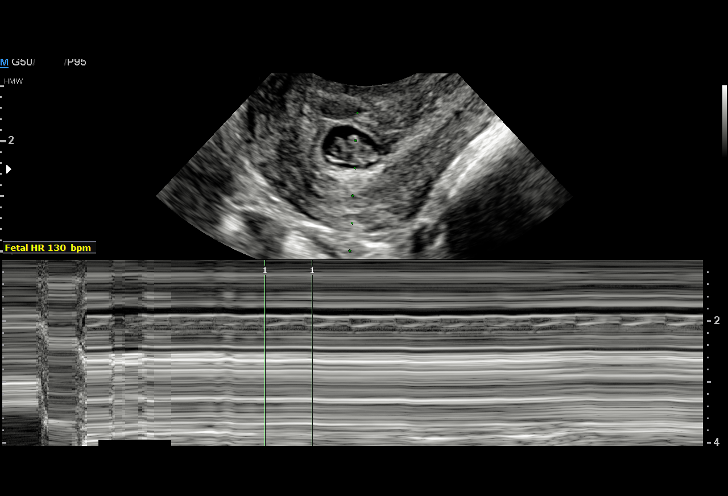
[im 61/61]
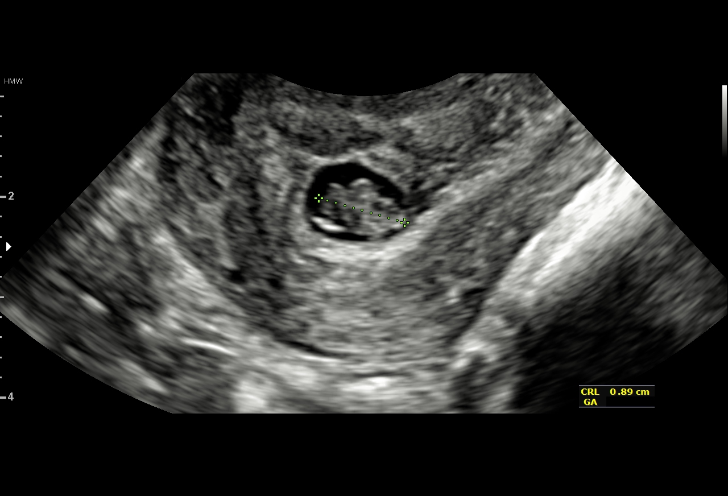

[15 of 28 positions shown; findings below may reference images not displayed]

FINDINGS: Intrauterine gestational sac: Single intrauterine gestational sac
appears normal in position and contour. Subjectively, there is
oligohydramnios.

Yolk sac:  Not Visualized.

Embryo:  Visualized.

Embryonic Cardiac Activity: Visualized.

Embryonic Heart Rate: 130  bpm

CRL:  9.2  mm   6 w   6 d                  US EDC: 01/07/2018

Subchorionic hemorrhage:  None visualized.

Maternal uterus/adnexae: Left ovary measures 3.4 x 2.8 x 3.1 cm and
contains a corpus luteum. Right ovary measures 4.1 x 2.4 x 2.7 cm.
No suspicious ovarian or adnexal masses. No abnormal free fluid in
the pelvis. No uterine fibroids.
IMPRESSION: 1. Single living intrauterine gestation at 6 weeks 6 days by
crown-rump length. First-trimester oligohydramnios and
nonvisualization of the yolk sac. These findings are worrisome
prognostic factors. Follow-up obstetric ultrasound in 4 weeks
recommended to assess for appropriate fetal growth.
2. No ovarian or adnexal abnormality.

## 2018-05-19 ENCOUNTER — Other Ambulatory Visit: Payer: Self-pay

## 2018-05-19 ENCOUNTER — Emergency Department (HOSPITAL_COMMUNITY)
Admission: EM | Admit: 2018-05-19 | Discharge: 2018-05-19 | Disposition: A | Discharge status: Home / Self Care | Payer: 59 | Attending: Emergency Medicine | Admitting: Emergency Medicine

## 2018-05-19 ENCOUNTER — Encounter (HOSPITAL_COMMUNITY): Payer: Self-pay | Admitting: Emergency Medicine

## 2018-05-19 DIAGNOSIS — J029 Acute pharyngitis, unspecified: Secondary | ICD-10-CM | POA: Diagnosis not present

## 2018-05-19 DIAGNOSIS — Z79899 Other long term (current) drug therapy: Secondary | ICD-10-CM | POA: Insufficient documentation

## 2018-05-19 LAB — GROUP A STREP BY PCR: Group A Strep by PCR: NOT DETECTED

## 2018-05-19 LAB — POC URINE PREG, ED: Preg Test, Ur: NEGATIVE

## 2018-05-19 NOTE — Discharge Instructions (Addendum)
Please return for any problem.  Follow-up with your regular doctor as instructed. °

## 2018-05-19 NOTE — ED Provider Notes (Signed)
[facility]-EMERGENCY DEPT Provider Note   CSN: [id number] Arrival date & time: 05/19/18  4098     History   Chief Complaint Chief Complaint  Patient presents with  . Sore Throat    HPI Monica Armstrong is a 27 y.o. female.  27 year old female with prior medical history as detailed below presents with complaint of sore throat.  Patient reports about 1 week of sore throat.  She describes hoarseness.  She denies fever.  Patient is without complaint of cough, nausea, vomiting, chest pain, shortness of breath, or other acute complaint.  She is tried over-the-counter remedies with minimal relief.  The history is provided by the patient and medical records.  Sore Throat  This is a new problem. The current episode started more than 2 days ago. The problem occurs constantly. The problem has not changed since onset.Pertinent negatives include no chest pain, no abdominal pain, no headaches and no shortness of breath. Nothing aggravates the symptoms. Nothing relieves the symptoms. She has tried nothing for the symptoms.    Past Medical History:  Diagnosis Date  . GERD (gastroesophageal reflux disease)     There are no active problems to display for this patient.   Past Surgical History:  Procedure Laterality Date  . NO PAST SURGERIES       OB History    Gravida  2   Para  0   Term  0   Preterm  0   AB  2   Living  0     SAB  1   TAB  1   Ectopic  0   Multiple  0   Live Births  0            Home Medications    Prior to Admission medications   Medication Sig Start Date End Date Taking? Authorizing Provider  docusate sodium (COLACE) 100 MG capsule Take 1 capsule (100 mg total) by mouth every 12 (twelve) hours. 01/14/18   Mackuen, Courteney Lyn, MD  ketorolac (TORADOL) 10 MG tablet Take 1 tablet (10 mg total) by mouth every 6 (six) hours as needed. Patient not taking: Reported on 01/14/2018 01/07/18   Katrinka Blazing, [place], CNM  polyethylene glycol  [facility]) packet Take 17 g by mouth daily. Patient not taking: Reported on 01/14/2018 09/07/17   Lurline Idol    Family History Family History  Problem Relation Age of Onset  . Heart disease Paternal Grandmother   . Cancer Paternal Grandmother     Social History Social History   Tobacco Use  . Smoking status: Never Smoker  . Smokeless tobacco: Never Used  Substance Use Topics  . Alcohol use: Not Currently    Alcohol/week: 0.0 standard drinks    Comment: occasionally  . Drug use: No     Allergies   Patient has no known allergies.   Review of Systems Review of Systems  Respiratory: Negative for shortness of breath.   Cardiovascular: Negative for chest pain.  Gastrointestinal: Negative for abdominal pain.  Neurological: Negative for headaches.  All other systems reviewed and are negative.    Physical Exam Updated Vital Signs BP 116/83 (BP Location: Left Arm)   Pulse 89   Temp 98.8 F (37.1 C) (Oral)   Resp 16   LMP 04/01/2018   SpO2 100%   Physical Exam  Constitutional: She is oriented to person, place, and time. She appears well-developed and well-nourished. No distress.  HENT:  Head: Normocephalic and atraumatic.  Right Ear:  Hearing and tympanic membrane normal.  Left Ear: Hearing and tympanic membrane normal.  Mouth/Throat: Uvula is midline and oropharynx is clear and moist. No oral lesions. No uvula swelling. No oropharyngeal exudate, posterior oropharyngeal edema, posterior oropharyngeal erythema or tonsillar abscesses.  No evidence of PTA  Eyes: Pupils are equal, round, and reactive to light. Conjunctivae and EOM are normal.  Neck: Normal range of motion. Neck supple.  Cardiovascular: Normal rate, regular rhythm and normal heart sounds.  Pulmonary/Chest: Effort normal and breath sounds normal. No respiratory distress.  Abdominal: Soft. She exhibits no distension. There is no tenderness.  Musculoskeletal: Normal range of motion. She exhibits no edema or  deformity.  Neurological: She is alert and oriented to person, place, and time.  Skin: Skin is warm and dry.  Psychiatric: She has a normal mood and affect.  Nursing note and vitals reviewed.    ED Treatments / Results  Labs (all labs ordered are listed, but only abnormal results are displayed) Labs Reviewed  GROUP A STREP BY PCR    EKG None  Radiology No results found.  Procedures Procedures (including critical care time)  Medications Ordered in ED Medications - No data to display   Initial Impression / Assessment and Plan / ED Course  I have reviewed the triage vital signs and the nursing notes.  Pertinent labs & imaging results that were available during my care of the patient were reviewed by me and considered in my medical decision making (see chart for details).     MDM  Screen complete   Patient is presenting with likely viral sore throat.  She does not have evidence on exam or on screening of significant acute pathology.  Patient understands need for close follow-up.  Strict return precautions given and understood.  Plan for outpatient management of her symptoms was extensively discussed.  Final Clinical Impressions(s) / ED Diagnoses   Final diagnoses:  Viral pharyngitis    ED Discharge Orders    None       Wynetta Fines, MD 05/19/18 [phone]

## 2018-05-19 NOTE — ED Triage Notes (Addendum)
Pt complaint of ongoing sore throat for 1.5 weeks; concern related to "feeling like back of throat was starting to swell." Pt also verbalizes cough and diarrhea. Pt continues to verbalizes irregular periods "but slight chance of being pregnant." LMP 04/01/18.

## 2018-06-19 ENCOUNTER — Emergency Department (HOSPITAL_COMMUNITY)
Admission: EM | Admit: 2018-06-19 | Discharge: 2018-06-19 | Disposition: A | Discharge status: Home / Self Care | Payer: Managed Care, Other (non HMO) | Attending: Emergency Medicine | Admitting: Emergency Medicine

## 2018-06-19 ENCOUNTER — Encounter (HOSPITAL_COMMUNITY): Payer: Self-pay | Admitting: *Deleted

## 2018-06-19 DIAGNOSIS — J029 Acute pharyngitis, unspecified: Secondary | ICD-10-CM | POA: Diagnosis not present

## 2018-06-19 LAB — GROUP A STREP BY PCR: Group A Strep by PCR: NOT DETECTED

## 2018-06-19 MED ORDER — ACETAMINOPHEN 325 MG PO TABS
650.0000 mg | ORAL_TABLET | Freq: Once | ORAL | Status: AC
  Administered 2018-06-19: 650 mg via ORAL
  Filled 2018-06-19: qty 2

## 2018-06-19 NOTE — ED Triage Notes (Signed)
Pt complains of sore throat and blood when blowing nose and spitting since yesterday.

## 2018-06-19 NOTE — ED Provider Notes (Signed)
[facility]-EMERGENCY DEPT Provider Note   CSN: [id number] Arrival date & time: 06/19/18  1043     History   Chief Complaint Chief Complaint  Patient presents with  . Sore Throat    HPI Monica Armstrong is a 27 y.o. female with a hx of GERD who presents to the ED with complaint of sore throat that started yesterday. Patient states pain is to the bilateral throat, constant, currently a 10/10 in severity. This is worse with swallowing, but she is able to swallow and alleviated by hot lemon water. She reports associated nasal congestion with some blood streaked mucous coming from the nose, no overt nosebleeds. She has been having subjective fever/chills, but has not taken her temperature. Denies nausea, vomiting, ear pain, cough, chest pain, or dyspnea.   HPI  Past Medical History:  Diagnosis Date  . GERD (gastroesophageal reflux disease)     There are no active problems to display for this patient.   Past Surgical History:  Procedure Laterality Date  . NO PAST SURGERIES       OB History    Gravida  2   Para  0   Term  0   Preterm  0   AB  2   Living  0     SAB  1   TAB  1   Ectopic  0   Multiple  0   Live Births  0            Home Medications    Prior to Admission medications   Medication Sig Start Date End Date Taking? Authorizing Provider  docusate sodium (COLACE) 100 MG capsule Take 1 capsule (100 mg total) by mouth every 12 (twelve) hours. Patient not taking: Reported on 05/19/2018 01/14/18   Mackuen, Courteney Lyn, MD  ketorolac (TORADOL) 10 MG tablet Take 1 tablet (10 mg total) by mouth every 6 (six) hours as needed. Patient not taking: Reported on 01/14/2018 01/07/18   Katrinka Blazing, [place], CNM  metFORMIN (GLUCOPHAGE) 1000 MG tablet Take 1,000 mg by mouth daily. with food 05/12/18   [provider]  polyethylene glycol (MIRALAX) packet Take 17 g by mouth daily. Patient not taking: Reported on 01/14/2018 09/07/17   Lurline Idol    Family History Family History  Problem Relation Age of Onset  . Heart disease Paternal Grandmother   . Cancer Paternal Grandmother     Social History Social History   Tobacco Use  . Smoking status: Never Smoker  . Smokeless tobacco: Never Used  Substance Use Topics  . Alcohol use: Not Currently    Alcohol/week: 0.0 standard drinks    Comment: occasionally  . Drug use: No     Allergies   Patient has no known allergies.   Review of Systems Review of Systems  Constitutional: Positive for chills and fever.  HENT: Positive for congestion (w/ blood streaked mucous), sore throat and trouble swallowing (painful, but able). Negative for ear pain and nosebleeds.   Respiratory: Negative for cough and shortness of breath.   Cardiovascular: Negative for chest pain.     Physical Exam Updated Vital Signs BP 112/79 (BP Location: Right Arm)   Pulse (!) 113   Temp 99.7 F (37.6 C) (Oral)   Resp 17   Ht 4\' 11"  (1.499 m)   Wt 42.6 kg   SpO2 100%   BMI 18.99 kg/m   Physical Exam  Constitutional: She appears well-developed and well-nourished.  Non-toxic appearance. No distress.  HENT:  Head: Normocephalic and atraumatic.  Right Ear: Tympanic membrane normal. Tympanic membrane is not perforated, not erythematous, not retracted and not bulging.  Left Ear: Tympanic membrane normal. Tympanic membrane is not perforated, not erythematous, not retracted and not bulging.  Nose: Mucosal edema present. No nasal septal hematoma. No epistaxis.  Mouth/Throat: Uvula is midline. Oropharyngeal exudate and posterior oropharyngeal erythema present. Tonsils are 1+ on the right. Tonsils are 1+ on the left.  Posterior oropharynx is symmetric appearing. Patient tolerating own secretions without difficulty. No trismus. No drooling. No hot potato voice. No swelling beneath the tongue, submandibular compartment is soft.   Eyes: Pupils are equal, round, and reactive to light. Conjunctivae are  normal. Right eye exhibits no discharge. Left eye exhibits no discharge.  Neck: Normal range of motion. Neck supple.  Cardiovascular: Regular rhythm. Tachycardia present.  No murmur heard. Pulmonary/Chest: Breath sounds normal. No respiratory distress. She has no wheezes. She has no rales.  Abdominal: Soft. She exhibits no distension. There is no tenderness.  Lymphadenopathy:    She has no cervical adenopathy.  Neurological: She is alert.  Skin: Skin is warm and dry. No rash noted.  Psychiatric: She has a normal mood and affect. Her behavior is normal.  Nursing note and vitals reviewed.    ED Treatments / Results  Labs (all labs ordered are listed, but only abnormal results are displayed) Labs Reviewed  GROUP A STREP BY PCR    EKG None  Radiology No results found.  Procedures Procedures (including critical care time)  Medications Ordered in ED Medications  acetaminophen (TYLENOL) tablet 650 mg (650 mg Oral Given 06/19/18 1152)     Initial Impression / Assessment and Plan / ED Course  I have reviewed the triage vital signs and the nursing notes.  Pertinent labs & imaging results that were available during my care of the patient were reviewed by me and considered in my medical decision making (see chart for details).  Presents with complaint of sore throat.  Patient is nontoxic-appearing, mild tachycardia likely related to fever- given tylenol. On exam patient with tonsillar erythema/exudates. Strep test is negative. Exam non concerning for PTA or RPA, there is no trismus, uvular deviation, or hot potato voice. Patient is tolerating her own secretions without difficulty, full ROM of the neck, submandibular compartment is soft. Mild blood streaked mucous likely due to nasal irritation. Suspect viral at this time, if sxs persist would consider mono testing. Recommended use of Tylenol and Ibuprofen for any continued discomfort or fevers. I discussed results, treatment plan, need  for PCP follow-up, and return precautions with the patient. Provided opportunity for questions, patient confirmed understanding and is in agreement with plan.      Final Clinical Impressions(s) / ED Diagnoses   Final diagnoses:  Viral pharyngitis    ED Discharge Orders    None       Cherly Anderson, PA-C 06/19/18 1400    Long, Arlyss Repress, MD 06/20/18 [phone]

## 2018-06-19 NOTE — Discharge Instructions (Addendum)
You were seen in the ER today for a sore throat. Your strep test is negative. We suspect this is a virus. Please take tylenol/motrin for continued fevers/pain per over the counter dosing.  Follow up with your primary care provider within 1 week for re-evaluation. Return to the ER for new or worsening symptoms including but not limited to inability to swallow, trouble opening the mouth or moving the neck, or any other concerns.

## 2018-07-16 ENCOUNTER — Emergency Department (HOSPITAL_COMMUNITY): Payer: Managed Care, Other (non HMO)

## 2018-07-16 ENCOUNTER — Emergency Department (HOSPITAL_COMMUNITY)
Admission: EM | Admit: 2018-07-16 | Discharge: 2018-07-16 | Disposition: A | Discharge status: Home / Self Care | Payer: Managed Care, Other (non HMO) | Attending: Emergency Medicine | Admitting: Emergency Medicine

## 2018-07-16 ENCOUNTER — Encounter (HOSPITAL_COMMUNITY): Payer: Self-pay

## 2018-07-16 ENCOUNTER — Other Ambulatory Visit: Payer: Self-pay

## 2018-07-16 DIAGNOSIS — N938 Other specified abnormal uterine and vaginal bleeding: Secondary | ICD-10-CM | POA: Insufficient documentation

## 2018-07-16 DIAGNOSIS — R1084 Generalized abdominal pain: Secondary | ICD-10-CM

## 2018-07-16 DIAGNOSIS — Z79899 Other long term (current) drug therapy: Secondary | ICD-10-CM | POA: Insufficient documentation

## 2018-07-16 DIAGNOSIS — R1011 Right upper quadrant pain: Secondary | ICD-10-CM | POA: Diagnosis present

## 2018-07-16 DIAGNOSIS — N939 Abnormal uterine and vaginal bleeding, unspecified: Secondary | ICD-10-CM

## 2018-07-16 LAB — COMPREHENSIVE METABOLIC PANEL
ALBUMIN: 3.9 g/dL (ref 3.5–5.0)
ALK PHOS: 59 U/L (ref 38–126)
ALT: 10 U/L (ref 0–44)
ANION GAP: 6 (ref 5–15)
AST: 25 U/L (ref 15–41)
CALCIUM: 9 mg/dL (ref 8.9–10.3)
CO2: 28 mmol/L (ref 22–32)
Chloride: 102 mmol/L (ref 98–111)
Creatinine, Ser: 0.59 mg/dL (ref 0.44–1.00)
GFR calc Af Amer: >60 mL/min (ref >60)
GFR calc non Af Amer: >60 mL/min (ref >60)
GLUCOSE: 69 mg/dL — AB (ref 70–99)
Potassium: 4.9 mmol/L (ref 3.5–5.1)
SODIUM: 136 mmol/L (ref 135–145)
Total Bilirubin: 1.4 mg/dL — ABNORMAL HIGH (ref 0.3–1.2)
Total Protein: 7.9 g/dL (ref 6.5–8.1)

## 2018-07-16 LAB — URINALYSIS, ROUTINE W REFLEX MICROSCOPIC
Bilirubin Urine: NEGATIVE
Glucose, UA: NEGATIVE mg/dL
HGB URINE DIPSTICK: NEGATIVE
Ketones, ur: NEGATIVE mg/dL
LEUKOCYTES UA: NEGATIVE
Nitrite: NEGATIVE
PROTEIN: NEGATIVE mg/dL
Specific Gravity, Urine: 1.001 — ABNORMAL LOW (ref 1.005–1.030)
pH: 7 (ref 5.0–8.0)

## 2018-07-16 LAB — CBC WITH DIFFERENTIAL/PLATELET
Abs Immature Granulocytes: 0.03 10*3/uL (ref 0.00–0.07)
BASOS PCT: 0 %
Basophils Absolute: 0 10*3/uL (ref 0.0–0.1)
EOS ABS: 0.2 10*3/uL (ref 0.0–0.5)
Eosinophils Relative: 2 %
HEMATOCRIT: 39 % (ref 36.0–46.0)
HEMOGLOBIN: 12.7 g/dL (ref 12.0–15.0)
Immature Granulocytes: 0 %
Lymphocytes Relative: 48 %
Lymphs Abs: 3.4 10*3/uL (ref 0.7–4.0)
MCH: 29.7 pg (ref 26.0–34.0)
MCHC: 32.6 g/dL (ref 30.0–36.0)
MCV: 91.1 fL (ref 80.0–100.0)
MONOS PCT: 7 %
Monocytes Absolute: 0.5 10*3/uL (ref 0.1–1.0)
NEUTROS PCT: 43 %
NRBC: 0 % (ref 0.0–0.2)
Neutro Abs: 3.2 10*3/uL (ref 1.7–7.7)
Platelets: ADEQUATE 10*3/uL (ref 150–400)
RBC: 4.28 MIL/uL (ref 3.87–5.11)
RDW: 14.5 % (ref 11.5–15.5)
SMEAR REVIEW: ADEQUATE
WBC: 7.3 10*3/uL (ref 4.0–10.5)

## 2018-07-16 LAB — POC URINE PREG, ED: PREG TEST UR: NEGATIVE

## 2018-07-16 LAB — LIPASE, BLOOD: Lipase: 40 U/L (ref 11–51)

## 2018-07-16 MED ORDER — IBUPROFEN 600 MG PO TABS: 600.0000 mg | ORAL_TABLET | Freq: Four times a day (QID) | ORAL | 0 refills | Status: DC | PRN

## 2018-07-16 MED ORDER — IBUPROFEN 200 MG PO TABS
600.0000 mg | ORAL_TABLET | Freq: Once | ORAL | Status: AC
  Administered 2018-07-16: 600 mg via ORAL
  Filled 2018-07-16: qty 3

## 2018-07-16 NOTE — ED Notes (Signed)
Pelvic equipment setup in room

## 2018-07-16 NOTE — ED Triage Notes (Addendum)
Patient c/o hematuria, lower back pain, and bilateral lower abdominal pain, R>L. Patient reports that she was treated for a UTI in [place] one month ago.

## 2018-07-16 NOTE — ED Provider Notes (Signed)
[facility]-EMERGENCY DEPT Provider Note   CSN: [id number] Arrival date & time: 07/16/18  1643     History   Chief Complaint Chief Complaint  Patient presents with  . Abdominal Pain  . Hematuria  . Back Pain    HPI Monica Armstrong is a 27 y.o. female.  HPI Patient presents with right upper abdominal pain radiating to her back which started today.  Not associated with food.  No nausea or vomiting.  No diarrhea.  Had normal bowel movement this morning.  No fever or chills.  Patient states he has had vaginal spotting for the past month.  Denies urinary symptoms including dysuria, frequency or urgency.  States she was treated for urinary tract infection last month.  No previous abdominal surgeries. Past Medical History:  Diagnosis Date  . GERD (gastroesophageal reflux disease)     There are no active problems to display for this patient.   Past Surgical History:  Procedure Laterality Date  . NO PAST SURGERIES       OB History    Gravida  2   Para  0   Term  0   Preterm  0   AB  2   Living  0     SAB  1   TAB  1   Ectopic  0   Multiple  0   Live Births  0            Home Medications    Prior to Admission medications   Medication Sig Start Date End Date Taking? Authorizing Provider  guaiFENesin (MUCINEX) 600 MG 12 hr tablet Take 600 mg by mouth 2 (two) times daily as needed for cough.   Yes [provider]  Pseudoephedrine-APAP-DM (DAYQUIL PO) Take 30 mLs by mouth daily as needed (allergies).   Yes [provider]  ibuprofen (ADVIL,MOTRIN) 600 MG tablet Take 1 tablet (600 mg total) by mouth every 6 (six) hours as needed. 07/16/18   Loren Racer, MD    Family History Family History  Problem Relation Age of Onset  . Heart disease Paternal Grandmother   . Cancer Paternal Grandmother     Social History Social History   Tobacco Use  . Smoking status: Never Smoker  . Smokeless tobacco: Never Used    Substance Use Topics  . Alcohol use: Yes    Alcohol/week: 0.0 standard drinks    Comment: occasionally  . Drug use: No     Allergies   Patient has no known allergies.   Review of Systems Review of Systems  Constitutional: Negative for chills, fatigue and fever.  HENT: Negative for sore throat and trouble swallowing.   Respiratory: Negative for cough and shortness of breath.   Cardiovascular: Negative for chest pain.  Gastrointestinal: Positive for abdominal pain. Negative for constipation, diarrhea, nausea and vomiting.  Genitourinary: Positive for flank pain and vaginal bleeding. Negative for dysuria and frequency.  Musculoskeletal: Positive for back pain and myalgias. Negative for neck pain and neck stiffness.  Skin: Negative for rash and wound.  Neurological: Negative for dizziness, weakness, light-headedness, numbness and headaches.  All other systems reviewed and are negative.    Physical Exam Updated Vital Signs BP 110/81   Pulse 71   Temp 98.2 F (36.8 C)   Resp 20   Ht 4\' 11"  (1.499 m)   Wt 42.6 kg   LMP 07/02/2018   SpO2 100%   BMI 18.99 kg/m   Physical Exam  Constitutional: She is oriented  to person, place, and time. She appears well-developed and well-nourished. No distress.  HENT:  Head: Normocephalic and atraumatic.  Mouth/Throat: Oropharynx is clear and moist. No oropharyngeal exudate.  Eyes: Pupils are equal, round, and reactive to light. EOM are normal.  Neck: Normal range of motion. Neck supple. No JVD present.  Cardiovascular: Normal rate and regular rhythm. Exam reveals no gallop and no friction rub.  No murmur heard. Pulmonary/Chest: Effort normal and breath sounds normal. No stridor. No respiratory distress. She has no wheezes. She has no rales. She exhibits no tenderness.  Abdominal: Soft. Bowel sounds are normal. There is tenderness. There is no rebound and no guarding.  Mild right upper quadrant tenderness to palpation.  Very minimal  bilateral lower quadrant tenderness to palpation without rebound or guarding.  Musculoskeletal: Normal range of motion. She exhibits no edema or tenderness.  No midline thoracic or lumbar tenderness.  No definite CVA tenderness.  No lower extremity swelling, asymmetry or tenderness.  Lymphadenopathy:    She has no cervical adenopathy.  Neurological: She is alert and oriented to person, place, and time.  Moves all extremities without focal deficit.  Sensation fully intact.  Skin: Skin is warm and dry. Capillary refill takes less than 2 seconds. No rash noted. She is not diaphoretic. No erythema.  Psychiatric: She has a normal mood and affect. Her behavior is normal.  Nursing note and vitals reviewed.    ED Treatments / Results  Labs (all labs ordered are listed, but only abnormal results are displayed) Labs Reviewed  URINALYSIS, ROUTINE W REFLEX MICROSCOPIC - Abnormal; Notable for the following components:      Result Value   Color, Urine STRAW (*)    Specific Gravity, Urine 1.001 (*)    All other components within normal limits  COMPREHENSIVE METABOLIC PANEL - Abnormal; Notable for the following components:   Glucose, Bld 69 (*)    BUN <5 (*)    Total Bilirubin 1.4 (*)    All other components within normal limits  WET PREP, GENITAL  CBC WITH DIFFERENTIAL/PLATELET  LIPASE, BLOOD  POC URINE PREG, ED  GC/CHLAMYDIA PROBE AMP ([place]) NOT [facility]    EKG None  Radiology [place] Abdomen Limited  Result Date: 07/16/2018 CLINICAL DATA:  RIGHT upper quadrant pain. EXAM: ULTRASOUND ABDOMEN LIMITED RIGHT UPPER QUADRANT COMPARISON:  CT abdomen and pelvis January 14, 2018 FINDINGS: Gallbladder: Contracted gallbladder. No gallstones or wall thickening visualized. No sonographic Murphy sign noted by sonographer. Common bile duct: Diameter: 1 mm Liver: No focal lesion identified. Within normal limits in parenchymal echogenicity. Portal vein is patent on color Doppler imaging with normal direction  of blood flow towards the liver. Included RIGHT kidney appears mildly echogenic. IMPRESSION: 1. No acute process RIGHT upper quadrant. 2. Mildly echogenic kidney may be technical or reflect medical renal disease. Electronically Signed   By: Awilda Metroourtnay  Bloomer M.D.   On: 07/16/2018 18:40    Procedures Procedures (including critical care time)  Medications Ordered in ED Medications  ibuprofen (ADVIL,MOTRIN) tablet 600 mg (600 mg Oral Given 07/16/18 2000)     Initial Impression / Assessment and Plan / ED Course  I have reviewed the triage vital signs and the nursing notes.  Pertinent labs & imaging results that were available during my care of the patient were reviewed by me and considered in my medical decision making (see chart for details).    Patient has had 2 CT scans for abdominal pain this year.  Discussed risk of  excessive radiation. Do not believe that she warrants another CT scan at this time.  No evidence of UTI.  No hematuria on UA.  No evidence of gallbladder disease.  Laboratory work-up is normal.  Given vaginal bleeding and some lower abdominal discomfort offered pelvic exam.  Patient decided she would rather follow-up with her OB/GYN.  Discharge instructions with return precautions given.  Final Clinical Impressions(s) / ED Diagnoses   Final diagnoses:  Generalized abdominal pain  Abnormal uterine bleeding    ED Discharge Orders         Ordered    ibuprofen (ADVIL,MOTRIN) 600 MG tablet  Every 6 hours PRN     07/16/18 2052           Loren Racer, MD 07/16/18 2055

## 2018-11-03 ENCOUNTER — Other Ambulatory Visit: Payer: Self-pay

## 2018-11-03 ENCOUNTER — Encounter (HOSPITAL_COMMUNITY): Payer: Self-pay | Admitting: *Deleted

## 2018-11-03 ENCOUNTER — Emergency Department (HOSPITAL_COMMUNITY): Payer: Managed Care, Other (non HMO)

## 2018-11-03 ENCOUNTER — Emergency Department (HOSPITAL_COMMUNITY)
Admission: EM | Admit: 2018-11-03 | Discharge: 2018-11-03 | Disposition: A | Discharge status: Home / Self Care | Payer: Managed Care, Other (non HMO) | Attending: Emergency Medicine | Admitting: Emergency Medicine

## 2018-11-03 DIAGNOSIS — N898 Other specified noninflammatory disorders of vagina: Secondary | ICD-10-CM | POA: Insufficient documentation

## 2018-11-03 DIAGNOSIS — M545 Low back pain: Secondary | ICD-10-CM | POA: Diagnosis present

## 2018-11-03 DIAGNOSIS — N390 Urinary tract infection, site not specified: Secondary | ICD-10-CM

## 2018-11-03 DIAGNOSIS — N83201 Unspecified ovarian cyst, right side: Secondary | ICD-10-CM

## 2018-11-03 DIAGNOSIS — N83202 Unspecified ovarian cyst, left side: Secondary | ICD-10-CM | POA: Diagnosis not present

## 2018-11-03 DIAGNOSIS — N760 Acute vaginitis: Secondary | ICD-10-CM | POA: Insufficient documentation

## 2018-11-03 DIAGNOSIS — B9689 Other specified bacterial agents as the cause of diseases classified elsewhere: Secondary | ICD-10-CM

## 2018-11-03 DIAGNOSIS — Z79899 Other long term (current) drug therapy: Secondary | ICD-10-CM | POA: Insufficient documentation

## 2018-11-03 LAB — COMPREHENSIVE METABOLIC PANEL
ALT: 18 U/L (ref 0–44)
AST: 19 U/L (ref 15–41)
Albumin: 4.4 g/dL (ref 3.5–5.0)
Alkaline Phosphatase: 69 U/L (ref 38–126)
Anion gap: 8 (ref 5–15)
BUN: 5 mg/dL — ABNORMAL LOW (ref 6–20)
CO2: 24 mmol/L (ref 22–32)
Calcium: 9.3 mg/dL (ref 8.9–10.3)
Chloride: 103 mmol/L (ref 98–111)
Creatinine, Ser: 0.63 mg/dL (ref 0.44–1.00)
GFR calc Af Amer: >60 mL/min (ref >60)
GFR calc non Af Amer: >60 mL/min (ref >60)
Glucose, Bld: 82 mg/dL (ref 70–99)
Potassium: 3.8 mmol/L (ref 3.5–5.1)
Sodium: 135 mmol/L (ref 135–145)
Total Bilirubin: 0.2 mg/dL — ABNORMAL LOW (ref 0.3–1.2)
Total Protein: 8.8 g/dL — ABNORMAL HIGH (ref 6.5–8.1)

## 2018-11-03 LAB — URINALYSIS, ROUTINE W REFLEX MICROSCOPIC
BILIRUBIN URINE: NEGATIVE
Glucose, UA: NEGATIVE mg/dL
Hgb urine dipstick: NEGATIVE
Ketones, ur: NEGATIVE mg/dL
Nitrite: NEGATIVE
PROTEIN: NEGATIVE mg/dL
Specific Gravity, Urine: 1.01 (ref 1.005–1.030)
pH: 6 (ref 5.0–8.0)

## 2018-11-03 LAB — CBC WITH DIFFERENTIAL/PLATELET
Abs Immature Granulocytes: 0.02 10*3/uL (ref 0.00–0.07)
Basophils Absolute: 0 10*3/uL (ref 0.0–0.1)
Basophils Relative: 1 %
Eosinophils Absolute: 0.2 10*3/uL (ref 0.0–0.5)
Eosinophils Relative: 2 %
HCT: 44.3 % (ref 36.0–46.0)
Hemoglobin: 14.4 g/dL (ref 12.0–15.0)
Immature Granulocytes: 0 %
Lymphocytes Relative: 40 %
Lymphs Abs: 3 10*3/uL (ref 0.7–4.0)
MCH: 29 pg (ref 26.0–34.0)
MCHC: 32.5 g/dL (ref 30.0–36.0)
MCV: 89.3 fL (ref 80.0–100.0)
Monocytes Absolute: 0.4 10*3/uL (ref 0.1–1.0)
Monocytes Relative: 6 %
Neutro Abs: 3.8 10*3/uL (ref 1.7–7.7)
Neutrophils Relative %: 51 %
Platelets: 299 10*3/uL (ref 150–400)
RBC: 4.96 MIL/uL (ref 3.87–5.11)
RDW: 13.8 % (ref 11.5–15.5)
WBC: 7.5 10*3/uL (ref 4.0–10.5)
nRBC: 0 % (ref 0.0–0.2)

## 2018-11-03 LAB — WET PREP, GENITAL
Sperm: NONE SEEN
Trich, Wet Prep: NONE SEEN
Yeast Wet Prep HPF POC: NONE SEEN

## 2018-11-03 LAB — HCG, QUANTITATIVE, PREGNANCY: hCG, Beta Chain, Quant, S: <1 m[IU]/mL (ref <5)

## 2018-11-03 LAB — HIV ANTIBODY (ROUTINE TESTING W REFLEX): HIV Screen 4th Generation wRfx: NONREACTIVE

## 2018-11-03 LAB — RPR: RPR Ser Ql: NONREACTIVE

## 2018-11-03 LAB — PREGNANCY, URINE: PREG TEST UR: NEGATIVE

## 2018-11-03 MED ORDER — CEFTRIAXONE SODIUM 250 MG IJ SOLR
250.0000 mg | Freq: Once | INTRAMUSCULAR | Status: AC
  Administered 2018-11-03: 250 mg via INTRAMUSCULAR
  Filled 2018-11-03: qty 250

## 2018-11-03 MED ORDER — CEPHALEXIN 500 MG PO CAPS: 500.0000 mg | ORAL_CAPSULE | Freq: Two times a day (BID) | ORAL | 0 refills | Status: DC

## 2018-11-03 MED ORDER — STERILE WATER FOR INJECTION IJ SOLN
INTRAMUSCULAR | Status: AC
  Administered 2018-11-03: 0.9 mL
  Filled 2018-11-03: qty 10

## 2018-11-03 MED ORDER — AZITHROMYCIN 250 MG PO TABS
1000.0000 mg | ORAL_TABLET | Freq: Once | ORAL | Status: AC
  Administered 2018-11-03: 1000 mg via ORAL
  Filled 2018-11-03: qty 4

## 2018-11-03 MED ORDER — METRONIDAZOLE 500 MG PO TABS: 500.0000 mg | ORAL_TABLET | Freq: Two times a day (BID) | ORAL | 0 refills | Status: DC

## 2018-11-03 NOTE — Discharge Instructions (Signed)
You have been treated for gonorrhea and chlamydia in the emergency department today.  You will be called in 3 days if your tests return positive.  Please make sure to tell all of your sexual partners that they will need to be treated as well and abstain from sexual intercourse until you have both been treated for 1 week.  Take Flagyl and Keflex until completed.  You will be called if there needs to be a change in your antibiotic regarding your urinary tract infection and urine culture that is pending.  Please follow-up with your OB/GYN for further evaluation and treatment of polycystic ovarian syndrome suggested on your ultrasound today.  Please return to emergency department develop any new or worsening symptoms.

## 2018-11-03 NOTE — ED Notes (Signed)
Pt reports she is concerned that she has a kidney infection because she has had one before . Reports urinary frequency, vaginal discharge and intermittent flank pain, pt currently denies pain. Denies pain with urination. Pt also states she took an at home preg test yesterday that was positive. Pt informs this nurse she has had false positive pregnancy test in the past.

## 2018-11-03 NOTE — ED Notes (Signed)
US at bedside

## 2018-11-03 NOTE — ED Triage Notes (Signed)
Pt c/o urinary frequency.  Pt stated "The last time I had a kidney infection I had to go to the [facility] a lot.  I also took a pregnancy test yesterday and it was positive.  I don't go to the doctor until next Friday, 4/3."  Pt denies dysuria.

## 2018-11-03 NOTE — ED Provider Notes (Signed)
[facility]   CSN: [id number] Arrival date & time: 11/03/18  1791    History   Chief Complaint Chief Complaint  Patient presents with  . Urinary Frequency  . Vaginal Discharge  . Flank Pain    left    HPI Monica Armstrong is a 28 y.o. female with history of GERD who presents with a 2-week history of urinary frequency and some mid low back pain.  Patient has also had an increase in vaginal discharge.  She describes it as white, milky.  She does not have a significant concern for STD exposure, however would like to be checked.  She reports she had a positive pregnancy test yesterday and called OB/GYN to make an appointment for next week.  LMP 08/02/2018.  Patient also reports she has had some intermittent epigastric pain with the symptoms.  Patient denies any fever, chest pain, shortness of breath, nausea, vomiting, flank pain.     HPI  Past Medical History:  Diagnosis Date  . GERD (gastroesophageal reflux disease)     There are no active problems to display for this patient.   Past Surgical History:  Procedure Laterality Date  . NO PAST SURGERIES       OB History    Gravida  3   Para  0   Term  0   Preterm  0   AB  2   Living  0     SAB  1   TAB  1   Ectopic  0   Multiple  0   Live Births  0            Home Medications    Prior to Admission medications   Medication Sig Start Date End Date Taking? Authorizing Provider  medroxyPROGESTERone (DEPO-PROVERA) 150 MG/ML injection Inject 150 mg into the muscle as directed. Every 3 months 07/29/18  Yes [provider]  cephALEXin (KEFLEX) 500 MG capsule Take 1 capsule (500 mg total) by mouth 2 (two) times daily. 11/03/18   Jeral Zick, Waylan Boga, PA-C  ibuprofen (ADVIL,MOTRIN) 600 MG tablet Take 1 tablet (600 mg total) by mouth every 6 (six) hours as needed. Patient not taking: Reported on 11/03/2018 07/16/18   Loren Racer, MD  metroNIDAZOLE (FLAGYL)  500 MG tablet Take 1 tablet (500 mg total) by mouth 2 (two) times daily. 11/03/18   Emi Holes, PA-C    Family History Family History  Problem Relation Age of Onset  . Heart disease Paternal Grandmother   . Cancer Paternal Grandmother     Social History Social History   Tobacco Use  . Smoking status: Never Smoker  . Smokeless tobacco: Never Used  Substance Use Topics  . Alcohol use: Yes    Alcohol/week: 0.0 standard drinks    Comment: occasionally  . Drug use: No     Allergies   Patient has no known allergies.   Review of Systems Review of Systems  Constitutional: Negative for chills and fever.  HENT: Negative for facial swelling and sore throat.   Respiratory: Negative for shortness of breath.   Cardiovascular: Negative for chest pain.  Gastrointestinal: Negative for abdominal pain, nausea and vomiting.  Genitourinary: Positive for frequency and vaginal discharge. Negative for dysuria, flank pain, pelvic pain, vaginal bleeding and vaginal pain.  Musculoskeletal: Negative for back pain.  Skin: Negative for rash and wound.  Neurological: Negative for headaches.  Psychiatric/Behavioral: The patient is not nervous/anxious.      Physical Exam  Updated Vital Signs BP 114/81   Pulse 72   Temp 97.9 F (36.6 C) (Oral)   Resp 15   Ht  (1.499 m)   Wt 50.3 kg   LMP 08/02/2018 (Approximate)   SpO2 100%   BMI 22.42 kg/m   Physical Exam Vitals signs and nursing note reviewed. Exam conducted with a chaperone present.  Constitutional:      General: She is not in acute distress.    Appearance: She is well-developed. She is not diaphoretic.  HENT:     Head: Normocephalic and atraumatic.     Mouth/Throat:     Pharynx: No oropharyngeal exudate.  Eyes:     General: No scleral icterus.       Right eye: No discharge.        Left eye: No discharge.     Conjunctiva/sclera: Conjunctivae normal.     Pupils: Pupils are equal, round, and reactive to light.  Neck:      Musculoskeletal: Normal range of motion and neck supple.     Thyroid: No thyromegaly.  Cardiovascular:     Rate and Rhythm: Normal rate and regular rhythm.     Heart sounds: Normal heart sounds. No murmur. No friction rub. No gallop.   Pulmonary:     Effort: Pulmonary effort is normal. No respiratory distress.     Breath sounds: Normal breath sounds. No stridor. No wheezing or rales.  Abdominal:     General: Bowel sounds are normal. There is no distension.     Palpations: Abdomen is soft.     Tenderness: There is abdominal tenderness in the epigastric area. There is no right CVA tenderness, left CVA tenderness, guarding or rebound.  Genitourinary:    Vagina: Vaginal discharge present.     Cervix: Discharge present. No cervical motion tenderness or erythema.     Uterus: Tender.      Adnexa:        Right: Tenderness present.        Left: No tenderness.    Lymphadenopathy:     Cervical: No cervical adenopathy.  Skin:    General: Skin is warm and dry.     Coloration: Skin is not pale.     Findings: No rash.  Neurological:     Mental Status: She is alert.     Coordination: Coordination normal.      ED Treatments / Results  Labs (all labs ordered are listed, but only abnormal results are displayed) Labs Reviewed  WET PREP, GENITAL - Abnormal; Notable for the following components:      Result Value   Clue Cells Wet Prep HPF POC PRESENT (*)    WBC, Wet Prep HPF POC MANY (*)    All other components within normal limits  URINALYSIS, ROUTINE W REFLEX MICROSCOPIC - Abnormal; Notable for the following components:   Leukocytes,Ua SMALL (*)    Bacteria, UA RARE (*)    All other components within normal limits  COMPREHENSIVE METABOLIC PANEL - Abnormal; Notable for the following components:   BUN 5 (*)    Total Protein 8.8 (*)    Total Bilirubin 0.2 (*)    All other components within normal limits  URINE CULTURE  PREGNANCY, URINE  RPR  HIV ANTIBODY (ROUTINE TESTING W REFLEX)   HCG, QUANTITATIVE, PREGNANCY  CBC WITH DIFFERENTIAL/PLATELET  GC/CHLAMYDIA PROBE AMP ([place]) NOT AT [facility]    EKG None  Radiology US Transvaginal Non-ob  Result Date: 11/03/2018 CLINICAL DATA:  Right adnexal pain  and tenderness. Clinical suspicion for ovarian torsion. EXAM: TRANSABDOMINAL AND TRANSVAGINAL ULTRASOUND OF PELVIS TECHNIQUE: Both transabdominal and transvaginal ultrasound examinations of the pelvis were performed. Transabdominal technique was performed for global imaging of the pelvis including uterus, ovaries, adnexal regions, and pelvic cul-de-sac. It was necessary to proceed with endovaginal exam following the transabdominal exam to visualize the endometrium and ovaries. COMPARISON:  01/14/2018 FINDINGS: Uterus Measurements: 6.2 x 3.3 x 4.2 cm = volume: 46 mL. No fibroids or other mass visualized. Endometrium Thickness: 6 mm.  No focal abnormality visualized. Right ovary Measurements: 2.8 x 2.6 x 2.6 cm = volume: 9.9 mL. No evidence of ovarian or adnexal mass. Multiple less than 1cm follicles are seen throughout the ovary, predominantly peripheral in location. Left ovary Measurements: 2.9 x 2.4 x 2.1 cm = volume: 7.4 mL. No evidence of ovarian or adnexal mass. Multiple less than 1cm follicles are seen throughout the ovary, predominantly peripheral in location. Other findings No abnormal free fluid. IMPRESSION: No evidence of pelvic mass or other acute findings. No sonographic evidence for ovarian torsion. Bilateral polycystic ovarian morphology noted. Recommend correlation for accompanying clinical and endocrinologic manifestations of polycystic ovary syndrome. Electronically Signed   By: Myles Rosenthal M.D.   On: 11/03/2018 10:56   US Pelvis Complete  Result Date: 11/03/2018 CLINICAL DATA:  Right adnexal pain and tenderness. Clinical suspicion for ovarian torsion. EXAM: TRANSABDOMINAL AND TRANSVAGINAL ULTRASOUND OF PELVIS TECHNIQUE: Both transabdominal and transvaginal ultrasound  examinations of the pelvis were performed. Transabdominal technique was performed for global imaging of the pelvis including uterus, ovaries, adnexal regions, and pelvic cul-de-sac. It was necessary to proceed with endovaginal exam following the transabdominal exam to visualize the endometrium and ovaries. COMPARISON:  01/14/2018 FINDINGS: Uterus Measurements: 6.2 x 3.3 x 4.2 cm = volume: 46 mL. No fibroids or other mass visualized. Endometrium Thickness: 6 mm.  No focal abnormality visualized. Right ovary Measurements: 2.8 x 2.6 x 2.6 cm = volume: 9.9 mL. No evidence of ovarian or adnexal mass. Multiple less than 1cm follicles are seen throughout the ovary, predominantly peripheral in location. Left ovary Measurements: 2.9 x 2.4 x 2.1 cm = volume: 7.4 mL. No evidence of ovarian or adnexal mass. Multiple less than 1cm follicles are seen throughout the ovary, predominantly peripheral in location. Other findings No abnormal free fluid. IMPRESSION: No evidence of pelvic mass or other acute findings. No sonographic evidence for ovarian torsion. Bilateral polycystic ovarian morphology noted. Recommend correlation for accompanying clinical and endocrinologic manifestations of polycystic ovary syndrome. Electronically Signed   By: Myles Rosenthal M.D.   On: 11/03/2018 10:56   [place] Art/ven Flow Abd Pelv Doppler  Result Date: 11/03/2018 CLINICAL DATA:  Right adnexal pain and tenderness. Clinical suspicion for ovarian torsion. EXAM: TRANSABDOMINAL AND TRANSVAGINAL ULTRASOUND OF PELVIS TECHNIQUE: Both transabdominal and transvaginal ultrasound examinations of the pelvis were performed. Transabdominal technique was performed for global imaging of the pelvis including uterus, ovaries, adnexal regions, and pelvic cul-de-sac. It was necessary to proceed with endovaginal exam following the transabdominal exam to visualize the endometrium and ovaries. COMPARISON:  01/14/2018 FINDINGS: Uterus Measurements: 6.2 x 3.3 x 4.2 cm = volume:  46 mL. No fibroids or other mass visualized. Endometrium Thickness: 6 mm.  No focal abnormality visualized. Right ovary Measurements: 2.8 x 2.6 x 2.6 cm = volume: 9.9 mL. No evidence of ovarian or adnexal mass. Multiple less than 1cm follicles are seen throughout the ovary, predominantly peripheral in location. Left ovary Measurements: 2.9 x 2.4 x 2.1 cm =  volume: 7.4 mL. No evidence of ovarian or adnexal mass. Multiple less than 1cm follicles are seen throughout the ovary, predominantly peripheral in location. Other findings No abnormal free fluid. IMPRESSION: No evidence of pelvic mass or other acute findings. No sonographic evidence for ovarian torsion. Bilateral polycystic ovarian morphology noted. Recommend correlation for accompanying clinical and endocrinologic manifestations of polycystic ovary syndrome. Electronically Signed   By: Myles Rosenthal M.D.   On: 11/03/2018 10:56    Procedures Procedures (including critical care time)  Medications Ordered in ED Medications  cefTRIAXone (ROCEPHIN) injection 250 mg (250 mg Intramuscular Given 11/03/18 1306)  azithromycin (ZITHROMAX) tablet 1,000 mg (1,000 mg Oral Given 11/03/18 1306)  sterile water (preservative free) injection (0.9 mLs  Given 11/03/18 1310)     Initial Impression / Assessment and Plan / ED Course  I have reviewed the triage vital signs and the nursing notes.  Pertinent labs & imaging results that were available during my care of the patient were reviewed by me and considered in my medical decision making (see chart for details).        Patient presenting with vaginal discharge, low back pain, and urinary frequency.  Labs are unremarkable.  UA shows borderline UTI, will start Keflex.  Urine culture sent.  Urine and hCG quant is negative for pregnancy.  GC/chlamydia, HIV, RPR sent and pending.  Wet prep shows clue cells.  Rocephin and azithromycin initiated.  Ultrasound shows no findings of TOA or ovarian torsion, but shows bilateral  polycystic ovarian morphology.  Patient be referred to OB/GYN for further evaluation of this.  Patient will be discharged home with Flagyl and Keflex.  Return precautions discussed.  Patient understands and agrees with plan.  Patient vitals stable throughout ED course and discharged in satisfactory condition.  Final Clinical Impressions(s) / ED Diagnoses   Final diagnoses:  Lower urinary tract infectious disease  BV (bacterial vaginosis)  Bilateral ovarian cysts    ED Discharge Orders         Ordered    metroNIDAZOLE (FLAGYL) 500 MG tablet  2 times daily     11/03/18 1205    cephALEXin (KEFLEX) 500 MG capsule  2 times daily     11/03/18 [address], [place] 11/03/18 1555    Donnetta Hutching, MD 11/05/18 1700

## 2018-11-03 NOTE — ED Notes (Signed)
Patient in restroom providing urine sample. Pelvic cart set up at bedside.

## 2018-11-04 LAB — GC/CHLAMYDIA PROBE AMP (~~LOC~~) NOT AT ARMC
Chlamydia: NEGATIVE
Neisseria Gonorrhea: NEGATIVE

## 2018-11-05 LAB — URINE CULTURE: Culture: 10000 — AB

## 2018-11-07 ENCOUNTER — Telehealth: Payer: Self-pay | Admitting: Emergency Medicine

## 2018-11-07 NOTE — Telephone Encounter (Signed)
Post ED Visit - Positive Culture Follow-up  Culture report reviewed by antimicrobial stewardship pharmacist: [facility] []  Enzo Bi, Pharm.D. []  Celedonio Miyamoto, Pharm.D., BCPS AQ-ID []  Garvin Fila, Pharm.D., BCPS []  Georgina Pillion, Pharm.D., BCPS []  [place], [place].D., BCPS, AAHIVP []  Estella Husk, Pharm.D., BCPS, AAHIVP []  Lysle Pearl, PharmD, BCPS []  Phillips Climes, PharmD, BCPS []  Agapito Games, PharmD, BCPS []  Verlan Friends, PharmD []  Mervyn Gay, PharmD, BCPS []  Vinnie Level, PharmD  [facility] []  Len Childs, PharmD []  Greer Pickerel, PharmD []  Adalberto Cole, PharmD []  Perlie Gold, Rph []  Lonell Face) Jean Rosenthal, PharmD []  Earl Many, PharmD []  Junita Push, PharmD []  Dorna Leitz, PharmD []  Terrilee Files, PharmD []  Lynann Beaver, PharmD []  Keturah Barre, PharmD []  Loralee Pacas, PharmD [x]  Bernadene Person, PharmD   Positive urine culture Treated with Cephalexin, organism sensitive to the same and no further patient follow-up is required at this time.  Carollee Herter Subhan Hoopes 11/07/2018, 4:40 PM

## 2019-03-30 ENCOUNTER — Encounter (HOSPITAL_COMMUNITY): Payer: Self-pay

## 2019-03-30 ENCOUNTER — Emergency Department (HOSPITAL_COMMUNITY)
Admission: EM | Admit: 2019-03-30 | Discharge: 2019-03-30 | Disposition: A | Discharge status: Home / Self Care | Payer: Commercial Managed Care - PPO | Attending: Emergency Medicine | Admitting: Emergency Medicine

## 2019-03-30 ENCOUNTER — Other Ambulatory Visit: Payer: Self-pay

## 2019-03-30 DIAGNOSIS — N72 Inflammatory disease of cervix uteri: Secondary | ICD-10-CM | POA: Insufficient documentation

## 2019-03-30 DIAGNOSIS — M545 Low back pain: Secondary | ICD-10-CM | POA: Diagnosis not present

## 2019-03-30 DIAGNOSIS — R110 Nausea: Secondary | ICD-10-CM | POA: Insufficient documentation

## 2019-03-30 DIAGNOSIS — R1030 Lower abdominal pain, unspecified: Secondary | ICD-10-CM | POA: Diagnosis present

## 2019-03-30 LAB — URINALYSIS, ROUTINE W REFLEX MICROSCOPIC
Bilirubin Urine: NEGATIVE
Glucose, UA: NEGATIVE mg/dL
Hgb urine dipstick: NEGATIVE
Ketones, ur: NEGATIVE mg/dL
Nitrite: NEGATIVE
Protein, ur: NEGATIVE mg/dL
Specific Gravity, Urine: 1.005 (ref 1.005–1.030)
pH: 7 (ref 5.0–8.0)

## 2019-03-30 LAB — WET PREP, GENITAL
Clue Cells Wet Prep HPF POC: NONE SEEN
Sperm: NONE SEEN
Trich, Wet Prep: NONE SEEN
Yeast Wet Prep HPF POC: NONE SEEN

## 2019-03-30 LAB — PREGNANCY, URINE: Preg Test, Ur: NEGATIVE

## 2019-03-30 MED ORDER — CEFTRIAXONE SODIUM 250 MG IJ SOLR
250.0000 mg | Freq: Once | INTRAMUSCULAR | Status: AC
  Administered 2019-03-30: 15:00:00 250 mg via INTRAMUSCULAR
  Filled 2019-03-30: qty 250

## 2019-03-30 MED ORDER — DOXYCYCLINE HYCLATE 100 MG PO CAPS: 100.0000 mg | ORAL_CAPSULE | Freq: Two times a day (BID) | ORAL | 0 refills | Status: DC

## 2019-03-30 MED ORDER — IBUPROFEN 600 MG PO TABS: 600.0000 mg | ORAL_TABLET | Freq: Four times a day (QID) | ORAL | 0 refills | Status: DC | PRN

## 2019-03-30 MED ORDER — AZITHROMYCIN 250 MG PO TABS
1000.0000 mg | ORAL_TABLET | Freq: Once | ORAL | Status: AC
  Administered 2019-03-30: 15:00:00 1000 mg via ORAL
  Filled 2019-03-30: qty 4

## 2019-03-30 MED ORDER — LIDOCAINE HCL 2 % IJ SOLN
INTRAMUSCULAR | Status: AC
  Administered 2019-03-30: 15:00:00 400 mg
  Filled 2019-03-30: qty 20

## 2019-03-30 NOTE — Discharge Instructions (Addendum)
You have been diagnosed with cervicitis.  Take antibiotic as prescribed.  Avoid sexual activity until your symptoms resolved.  Return if you have any concerns.

## 2019-03-30 NOTE — ED Provider Notes (Signed)
[facility] Provider Note   CSN: [id number] Arrival date & time: 03/30/19  0945     History   Chief Complaint Chief Complaint  Patient presents with  . Abdominal Pain  . Back Pain  . Urinary Frequency    HPI Monica Armstrong is a 28 y.o. female.     The history is provided by the patient. No language interpreter was used.     28 year old female with history of GERD presenting for evaluation of abdominal pain.  Patient reports for the past week she has had recurrent lower abdominal pain.  She described as a crampy sensation that goes across her lower abdomen to her back, waxing waning, symptom worsening with certain position and she often have to try to find a comfortable position for the pain to go away.  Pain is currently minimal at this time.  She endorsed occasional nausea with eating without vomiting or diarrhea.  She denies any associated fever chills no chest pain shortness of breath productive cough cold symptoms diarrhea constipation vaginal bleeding vaginal discharge or dysuria.  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  She missed her last Depo shot in June.  She does have unprotected sex with the same partner.  She denies any specific treatment tried at home.     Past Medical History:  Diagnosis Date  . GERD (gastroesophageal reflux disease)     There are no active problems to display for this patient.   Past Surgical History:  Procedure Laterality Date  . NO PAST SURGERIES       OB History    Gravida  3   Para  0   Term  0   Preterm  0   AB  2   Living  0     SAB  1   TAB  1   Ectopic  0   Multiple  0   Live Births  0            Home Medications    Prior to Admission medications   Medication Sig Start Date End Date Taking? Authorizing Provider  cephALEXin (KEFLEX) 500 MG capsule Take 1 capsule (500 mg total) by mouth 2 (two) times daily. 11/03/18   Law, Waylan BogaAlexandra M, PA-C  ibuprofen (ADVIL,MOTRIN)  600 MG tablet Take 1 tablet (600 mg total) by mouth every 6 (six) hours as needed. Patient not taking: Reported on 11/03/2018 07/16/18   Loren RacerYelverton, David, MD  medroxyPROGESTERone (DEPO-PROVERA) 150 MG/ML injection Inject 150 mg into the muscle as directed. Every 3 months 07/29/18   [provider]  metroNIDAZOLE (FLAGYL) 500 MG tablet Take 1 tablet (500 mg total) by mouth 2 (two) times daily. 11/03/18   Emi HolesLaw, Alexandra M, PA-C    Family History Family History  Problem Relation Age of Onset  . Heart disease Paternal Grandmother   . Cancer Paternal Grandmother     Social History Social History   Tobacco Use  . Smoking status: Never Smoker  . Smokeless tobacco: Never Used  Substance Use Topics  . Alcohol use: Yes    Alcohol/week: 0.0 standard drinks    Comment: occasionally  . Drug use: No     Allergies   Patient has no known allergies.   Review of Systems Review of Systems  All other systems reviewed and are negative.    Physical Exam Updated Vital Signs BP (!) 132/97 (BP Location: Left Arm)   Pulse 80   Temp 98.4 F (36.9 C) (  Oral)   Resp 15   Ht 4\' 11"  (1.499 m)   Wt 51.7 kg   LMP 09/02/2018   SpO2 100%   Breastfeeding Unknown   BMI 23.03 kg/m   Physical Exam Vitals signs and nursing note reviewed.  Constitutional:      General: She is not in acute distress.    Appearance: She is well-developed.  HENT:     Head: Atraumatic.  Eyes:     Conjunctiva/sclera: Conjunctivae normal.  Neck:     Musculoskeletal: Neck supple.  Cardiovascular:     Rate and Rhythm: Normal rate and regular rhythm.     Pulses: Normal pulses.     Heart sounds: Normal heart sounds.  Pulmonary:     Effort: Pulmonary effort is normal.     Breath sounds: Normal breath sounds.  Abdominal:     Palpations: Abdomen is soft.     Tenderness: There is no abdominal tenderness. There is no right CVA tenderness or left CVA tenderness.  Genitourinary:    Comments: Chaperone present  during exam.  Normal external genitalia.  Mild discomfort with speculum insertion.  Moderate amount of whitish/yellowish curdy discharge noted in vaginal vault.  Cervical  Os visualized and is closed.  On bimanual examination bilateral adnexal tenderness without cervical motion tenderness Skin:    Findings: No rash.  Neurological:     Mental Status: She is alert.      ED Treatments / Results  Labs (all labs ordered are listed, but only abnormal results are displayed) Labs Reviewed  WET PREP, GENITAL - Abnormal; Notable for the following components:      Result Value   WBC, Wet Prep HPF POC MODERATE (*)    All other components within normal limits  URINALYSIS, ROUTINE W REFLEX MICROSCOPIC - Abnormal; Notable for the following components:   Color, Urine STRAW (*)    Leukocytes,Ua TRACE (*)    Bacteria, UA MANY (*)    All other components within normal limits  PREGNANCY, URINE  RPR  HIV ANTIBODY (ROUTINE TESTING W REFLEX)  GC/CHLAMYDIA PROBE AMP ([place]) NOT AT [facility]    EKG None  Radiology No results found.  Procedures Procedures (including critical care time)  Medications Ordered in ED Medications  cefTRIAXone (ROCEPHIN) injection 250 mg (has no administration in time range)  azithromycin (ZITHROMAX) tablet 1,000 mg (has no administration in time range)     Initial Impression / Assessment and Plan / ED Course  I have reviewed the triage vital signs and the nursing notes.  Pertinent labs & imaging results that were available during my care of the patient were reviewed by me and considered in my medical decision making (see chart for details).        BP (!) 132/97 (BP Location: Left Arm)   Pulse 80   Temp 98.4 F (36.9 C) (Oral)   Resp 15   Ht 4\' 11"  (1.499 m)   Wt 51.7 kg   LMP 09/02/2018   SpO2 100%   Breastfeeding Unknown   BMI 23.03 kg/m    Final Clinical Impressions(s) / ED Diagnoses   Final diagnoses:  Cervicitis    ED Discharge Orders          Ordered    ibuprofen (ADVIL) 600 MG tablet  Every 6 hours PRN     03/30/19 1447    doxycycline (VIBRAMYCIN) 100 MG capsule  2 times daily     03/30/19 1447         1:07  PM Patient endorses lower abdominal discomfort for nearly a week.  She has minimal abdominal tenderness on exam.  She is well-appearing.  Her UA shows many bacteria, pregnancy test is negative.  Patient will benefit from an STI screening along with pelvic examination.  2:08 PM Patient with moderate discomfort on pelvic examination concerning for cervicitis possible PID.  Will give Rocephin and Zithromax antibiotic here.   Domenic Moras, PA-C 03/30/19 1448    Davonna Belling, MD 03/30/19 1504

## 2019-03-30 NOTE — ED Triage Notes (Signed)
Patient c/o bilateral lower abdominal pain and bilateral lower back pain with urinary frequency x 2 weeks. Patient denies dysuria. Patient denies any vaginal discharge.

## 2019-03-30 NOTE — ED Notes (Signed)
Failed attempt to collect labs. RN will be notified

## 2019-03-31 LAB — GC/CHLAMYDIA PROBE AMP (~~LOC~~) NOT AT ARMC
Chlamydia: NEGATIVE
Neisseria Gonorrhea: NEGATIVE

## 2019-03-31 LAB — RPR: RPR Ser Ql: NONREACTIVE

## 2019-03-31 LAB — HIV ANTIBODY (ROUTINE TESTING W REFLEX): HIV Screen 4th Generation wRfx: NONREACTIVE

## 2019-07-27 ENCOUNTER — Emergency Department
Admission: EM | Admit: 2019-07-27 | Discharge: 2019-07-27 | Disposition: A | Discharge status: Home / Self Care | Payer: Commercial Managed Care - PPO | Attending: Emergency Medicine | Admitting: Emergency Medicine

## 2019-07-27 ENCOUNTER — Emergency Department: Payer: Commercial Managed Care - PPO

## 2019-07-27 ENCOUNTER — Other Ambulatory Visit: Payer: Self-pay

## 2019-07-27 DIAGNOSIS — R1030 Lower abdominal pain, unspecified: Secondary | ICD-10-CM

## 2019-07-27 DIAGNOSIS — N739 Female pelvic inflammatory disease, unspecified: Secondary | ICD-10-CM | POA: Insufficient documentation

## 2019-07-27 DIAGNOSIS — N730 Acute parametritis and pelvic cellulitis: Secondary | ICD-10-CM

## 2019-07-27 DIAGNOSIS — R102 Pelvic and perineal pain: Secondary | ICD-10-CM

## 2019-07-27 LAB — COMPREHENSIVE METABOLIC PANEL
ALT: 17 U/L (ref 0–44)
AST: 21 U/L (ref 15–41)
Albumin: 3.6 g/dL (ref 3.5–5.0)
Alkaline Phosphatase: 52 U/L (ref 38–126)
Anion gap: 9 (ref 5–15)
BUN: 7 mg/dL (ref 6–20)
CO2: 23 mmol/L (ref 22–32)
Calcium: 9 mg/dL (ref 8.9–10.3)
Chloride: 108 mmol/L (ref 98–111)
Creatinine, Ser: 0.63 mg/dL (ref 0.44–1.00)
GFR calc Af Amer: >60 mL/min (ref >60)
GFR calc non Af Amer: >60 mL/min (ref >60)
Glucose, Bld: 88 mg/dL (ref 70–99)
Potassium: 4 mmol/L (ref 3.5–5.1)
Sodium: 140 mmol/L (ref 135–145)
Total Bilirubin: 0.7 mg/dL (ref 0.3–1.2)
Total Protein: 7.1 g/dL (ref 6.5–8.1)

## 2019-07-27 LAB — CBC WITH DIFFERENTIAL/PLATELET
Abs Immature Granulocytes: 0.02 10*3/uL (ref 0.00–0.07)
Basophils Absolute: 0 10*3/uL (ref 0.0–0.1)
Basophils Relative: 1 %
Eosinophils Absolute: 0.1 10*3/uL (ref 0.0–0.5)
Eosinophils Relative: 2 %
HCT: 38.6 % (ref 36.0–46.0)
Hemoglobin: 13.4 g/dL (ref 12.0–15.0)
Immature Granulocytes: 0 %
Lymphocytes Relative: 43 %
Lymphs Abs: 2.8 10*3/uL (ref 0.7–4.0)
MCH: 29 pg (ref 26.0–34.0)
MCHC: 34.7 g/dL (ref 30.0–36.0)
MCV: 83.5 fL (ref 80.0–100.0)
Monocytes Absolute: 0.4 10*3/uL (ref 0.1–1.0)
Monocytes Relative: 6 %
Neutro Abs: 3.2 10*3/uL (ref 1.7–7.7)
Neutrophils Relative %: 48 %
Platelets: 259 10*3/uL (ref 150–400)
RBC: 4.62 MIL/uL (ref 3.87–5.11)
RDW: 13.7 % (ref 11.5–15.5)
WBC: 6.5 10*3/uL (ref 4.0–10.5)
nRBC: 0 % (ref 0.0–0.2)

## 2019-07-27 LAB — URINALYSIS, COMPLETE (UACMP) WITH MICROSCOPIC
Bilirubin Urine: NEGATIVE
Glucose, UA: NEGATIVE mg/dL
Hgb urine dipstick: NEGATIVE
Ketones, ur: 5 mg/dL — AB
Nitrite: NEGATIVE
Protein, ur: NEGATIVE mg/dL
Specific Gravity, Urine: 1.023 (ref 1.005–1.030)
pH: 6 (ref 5.0–8.0)

## 2019-07-27 LAB — WET PREP, GENITAL
Clue Cells Wet Prep HPF POC: NONE SEEN
Sperm: NONE SEEN
Trich, Wet Prep: NONE SEEN
Yeast Wet Prep HPF POC: NONE SEEN

## 2019-07-27 LAB — LIPASE, BLOOD: Lipase: 32 U/L (ref 11–51)

## 2019-07-27 LAB — POCT PREGNANCY, URINE: Preg Test, Ur: NEGATIVE

## 2019-07-27 LAB — HCG, QUANTITATIVE, PREGNANCY: hCG, Beta Chain, Quant, S: <1 m[IU]/mL (ref <5)

## 2019-07-27 MED ORDER — DOXYCYCLINE HYCLATE 100 MG PO TABS
100.0000 mg | ORAL_TABLET | Freq: Once | ORAL | Status: AC
  Administered 2019-07-27: 100 mg via ORAL
  Filled 2019-07-27: qty 1

## 2019-07-27 MED ORDER — IOHEXOL 300 MG/ML  SOLN
75.0000 mL | Freq: Once | INTRAMUSCULAR | Status: AC | PRN
  Administered 2019-07-27: 75 mL via INTRAVENOUS

## 2019-07-27 MED ORDER — DOXYCYCLINE HYCLATE 100 MG PO CAPS: 100.0000 mg | ORAL_CAPSULE | Freq: Two times a day (BID) | ORAL | 0 refills | Status: AC

## 2019-07-27 MED ORDER — KETOROLAC TROMETHAMINE 30 MG/ML IJ SOLN
15.0000 mg | Freq: Once | INTRAMUSCULAR | Status: AC
  Administered 2019-07-27: 15 mg via INTRAVENOUS
  Filled 2019-07-27: qty 1

## 2019-07-27 MED ORDER — SODIUM CHLORIDE 0.9 % IV SOLN
1.0000 g | Freq: Once | INTRAVENOUS | Status: AC
  Administered 2019-07-27: 1 g via INTRAVENOUS
  Filled 2019-07-27: qty 10

## 2019-07-27 MED ORDER — ONDANSETRON HCL 4 MG/2ML IJ SOLN
4.0000 mg | Freq: Once | INTRAMUSCULAR | Status: AC
  Administered 2019-07-27: 4 mg via INTRAVENOUS
  Filled 2019-07-27: qty 2

## 2019-07-27 NOTE — ED Notes (Signed)
Pelvic cart at bedside. 

## 2019-07-27 NOTE — ED Provider Notes (Signed)
[facility] Emergency Department Provider Note  ____________________________________________  Time seen: Approximately 5:25 AM  I have reviewed the triage vital signs and the nursing notes.   HISTORY  Chief Complaint Abdominal Pain   HPI Monica Armstrong is a 28 y.o. female the history of GERD who presents for evaluation of abdominal pain.  Patient reports that 5 days ago she took a morning after pill.  The next day she woke up with the severe cramping lower abdominal pain.  She thought she was going to get her period.  Her period never came and the pain resolved.  This morning the pain started again.  She is complaining of severe sharp lower abdominal pain.  No nausea, no vomiting, no vaginal discharge, no vaginal bleeding, no fever or chills, no dysuria or hematuria, no diarrhea or constipation, no chest pain or shortness of breath.   Past Medical History:  Diagnosis Date  . GERD (gastroesophageal reflux disease)     There are no problems to display for this patient.   Past Surgical History:  Procedure Laterality Date  . NO PAST SURGERIES      Prior to Admission medications   Medication Sig Start Date End Date Taking? Authorizing Provider  doxycycline (VIBRAMYCIN) 100 MG capsule Take 1 capsule (100 mg total) by mouth 2 (two) times daily. One po bid x 7 days 03/30/19   Fayrene Helperran, [place], PA-C  ibuprofen (ADVIL) 600 MG tablet Take 1 tablet (600 mg total) by mouth every 6 (six) hours as needed. 03/30/19   Fayrene Helperran, [place], PA-C    Allergies Patient has no known allergies.  Family History  Problem Relation Age of Onset  . Heart disease Paternal Grandmother   . Cancer Paternal Grandmother     Social History Social History   Tobacco Use  . Smoking status: Never Smoker  . Smokeless tobacco: Never Used  Substance Use Topics  . Alcohol use: Yes    Alcohol/week: 0.0 standard drinks    Comment: occasionally  . Drug use: No    Review of  Systems  Constitutional: Negative for fever. Eyes: Negative for visual changes. ENT: Negative for sore throat. Neck: No neck pain  Cardiovascular: Negative for chest pain. Respiratory: Negative for shortness of breath. Gastrointestinal: + lower abdominal pain. No vomiting or diarrhea. Genitourinary: Negative for dysuria. Musculoskeletal: Negative for back pain. Skin: Negative for rash. Neurological: Negative for headaches, weakness or numbness. Psych: No SI or HI  ____________________________________________   PHYSICAL EXAM:  VITAL SIGNS: ED Triage Vitals  Enc Vitals Group     BP 07/27/19 0501 107/71     Pulse Rate 07/27/19 0501 68     Resp 07/27/19 0501 16     Temp 07/27/19 0501 98.9 F (37.2 C)     Temp Source 07/27/19 0501 Oral     SpO2 --      Weight 07/27/19 0502 104 lb (47.2 kg)     Height 07/27/19 0502 4\' 11"  (1.499 m)     Head Circumference --      Peak Flow --      Pain Score 07/27/19 0502 8     Pain Loc --      Pain Edu? --      Excl. in GC? --     Constitutional: Alert and oriented. Well appearing and in no apparent distress. HEENT:      Head: Normocephalic and atraumatic.         Eyes: Conjunctivae are normal. Sclera is non-icteric.  Mouth/Throat: Mucous membranes are moist.       Neck: Supple with no signs of meningismus. Cardiovascular: Regular rate and rhythm. No murmurs, gallops, or rubs. 2+ symmetrical distal pulses are present in all extremities. No JVD. Respiratory: Normal respiratory effort. Lungs are clear to auscultation bilaterally. No wheezes, crackles, or rhonchi.  Gastrointestinal: Soft, diffusely tender to palpation worse on the suprapubic region, and non distended with positive bowel sounds. No rebound or guarding. Genitourinary: No CVA tenderness. Pelvic exam: Normal external genitalia, no rashes or lesions. White watery discharge. Os closed. + CMT. No adnexal tenderness.   Musculoskeletal: Nontender with normal range of motion in  all extremities. No edema, cyanosis, or erythema of extremities. Neurologic: Normal speech and language. Face is symmetric. Moving all extremities. No gross focal neurologic deficits are appreciated. Skin: Skin is warm, dry and intact. No rash noted. Psychiatric: Mood and affect are normal. Speech and behavior are normal.  ____________________________________________   LABS (all labs ordered are listed, but only abnormal results are displayed)  Labs Reviewed  WET PREP, GENITAL - Abnormal; Notable for the following components:      Result Value   WBC, Wet Prep HPF POC FEW (*)    All other components within normal limits  URINALYSIS, COMPLETE (UACMP) WITH MICROSCOPIC - Abnormal; Notable for the following components:   Color, Urine YELLOW (*)    APPearance HAZY (*)    Ketones, ur 5 (*)    Leukocytes,Ua SMALL (*)    Bacteria, UA RARE (*)    All other components within normal limits  CHLAMYDIA/NGC RT PCR ([facility] ONLY)  GC/CHLAMYDIA PROBE AMP  CBC WITH DIFFERENTIAL/PLATELET  COMPREHENSIVE METABOLIC PANEL  LIPASE, BLOOD  HCG, QUANTITATIVE, PREGNANCY  POCT PREGNANCY, URINE   ____________________________________________  EKG  none  ____________________________________________  RADIOLOGY  CT a/p: PND ____________________________________________   PROCEDURES  Procedure(s) performed: None Procedures Critical Care performed:  None ____________________________________________   INITIAL IMPRESSION / ASSESSMENT AND PLAN / ED COURSE   28 y.o. female the history of GERD who presents for evaluation of abdominal pain that started after taking morning after pill.  Patient looks uncomfortable but is in no obvious distress otherwise with normal vital signs, abdomen is soft with diffuse tenderness worse in the suprapubic region.  Differential diagnosis is broad and includes UTI versus ovarian pathology versus ectopic pregnancy versus pregnancy versus tubo-ovarian abscess versus  PID versus STD versus appendicitis versus diverticulitis versus kidney stone versus pyelonephritis.  Will perform pelvic exam, check labs, hCG, wet prep, Gc/ chlamydia, urinalysis    _________________________ 7:02 AM on 07/27/2019 -----------------------------------------  Patient with CMT and heavy watery discharge concerning for PID. Labs WNL. CT pending to eval appendix. If that is normal, plan to get TVUS to rule out TOA. Patient given rocephin and doxy. Care transferred to Dr. Mayford Knife.     As part of my medical decision making, I reviewed the following data within the electronic MEDICAL RECORD NUMBER Nursing notes reviewed and incorporated, Labs reviewed , Old chart reviewed, Patient signed out to Dr. Mayford Knife, Notes from prior ED visits and [place] Controlled Substance Database   Please note:  Patient was evaluated in Emergency Department today for the symptoms described in the history of present illness. Patient was evaluated in the context of the global COVID-19 pandemic, which necessitated consideration that the patient might be at risk for infection with the SARS-CoV-2 virus that causes COVID-19. Institutional protocols and algorithms that pertain to the evaluation of patients at risk for COVID-19 are in  a state of rapid change based on information released by regulatory bodies including the CDC and federal and state organizations. These policies and algorithms were followed during the patient's care in the ED.  Some ED evaluations and interventions may be delayed as a result of limited staffing during the pandemic.   ____________________________________________   FINAL CLINICAL IMPRESSION(S) / ED DIAGNOSES   Final diagnoses:  Lower abdominal pain  PID (acute pelvic inflammatory disease)      NEW MEDICATIONS STARTED DURING THIS VISIT:  ED Discharge Orders    None       Note:  This document was prepared using Dragon voice recognition software and may include unintentional  dictation errors.    Rudene Re, MD 07/27/19 [phone]

## 2019-07-27 NOTE — ED Notes (Signed)
Patient taken for [place] at this time.  Will continue to monitor.

## 2019-07-27 NOTE — ED Provider Notes (Signed)
CT and ultrasound were reassuring.  She will be given pain medicine as well as antibiotics and advised to have close outpatient follow-up.   Earleen Newport, MD 07/27/19 [phone]

## 2019-07-27 NOTE — ED Triage Notes (Signed)
Pt to the er for abd pain around the umbilicus since Saturday. Pt denies any other symptoms. VSS

## 2019-07-30 LAB — GC/CHLAMYDIA PROBE AMP
Chlamydia trachomatis, NAA: NEGATIVE
Neisseria Gonorrhoeae by PCR: NEGATIVE

## 2019-08-26 ENCOUNTER — Encounter (HOSPITAL_COMMUNITY): Payer: Self-pay

## 2019-08-26 ENCOUNTER — Emergency Department (HOSPITAL_COMMUNITY): Payer: Self-pay

## 2019-08-26 ENCOUNTER — Other Ambulatory Visit: Payer: Self-pay

## 2019-08-26 ENCOUNTER — Emergency Department (HOSPITAL_COMMUNITY)
Admission: EM | Admit: 2019-08-26 | Discharge: 2019-08-26 | Disposition: A | Discharge status: Home / Self Care | Payer: Self-pay | Attending: Emergency Medicine | Admitting: Emergency Medicine

## 2019-08-26 DIAGNOSIS — R1030 Lower abdominal pain, unspecified: Secondary | ICD-10-CM

## 2019-08-26 DIAGNOSIS — R110 Nausea: Secondary | ICD-10-CM | POA: Insufficient documentation

## 2019-08-26 DIAGNOSIS — B373 Candidiasis of vulva and vagina: Secondary | ICD-10-CM | POA: Insufficient documentation

## 2019-08-26 DIAGNOSIS — R42 Dizziness and giddiness: Secondary | ICD-10-CM | POA: Insufficient documentation

## 2019-08-26 DIAGNOSIS — B9689 Other specified bacterial agents as the cause of diseases classified elsewhere: Secondary | ICD-10-CM

## 2019-08-26 DIAGNOSIS — N760 Acute vaginitis: Secondary | ICD-10-CM | POA: Insufficient documentation

## 2019-08-26 DIAGNOSIS — B379 Candidiasis, unspecified: Secondary | ICD-10-CM

## 2019-08-26 DIAGNOSIS — R102 Pelvic and perineal pain: Secondary | ICD-10-CM | POA: Insufficient documentation

## 2019-08-26 DIAGNOSIS — N939 Abnormal uterine and vaginal bleeding, unspecified: Secondary | ICD-10-CM | POA: Insufficient documentation

## 2019-08-26 LAB — CBC WITH DIFFERENTIAL/PLATELET
Abs Immature Granulocytes: 0.02 10*3/uL (ref 0.00–0.07)
Basophils Absolute: 0 10*3/uL (ref 0.0–0.1)
Basophils Relative: 1 %
Eosinophils Absolute: 0.1 10*3/uL (ref 0.0–0.5)
Eosinophils Relative: 2 %
HCT: 40.9 % (ref 36.0–46.0)
Hemoglobin: 13.6 g/dL (ref 12.0–15.0)
Immature Granulocytes: 0 %
Lymphocytes Relative: 46 %
Lymphs Abs: 2.6 10*3/uL (ref 0.7–4.0)
MCH: 30.1 pg (ref 26.0–34.0)
MCHC: 33.3 g/dL (ref 30.0–36.0)
MCV: 90.5 fL (ref 80.0–100.0)
Monocytes Absolute: 0.4 10*3/uL (ref 0.1–1.0)
Monocytes Relative: 6 %
Neutro Abs: 2.6 10*3/uL (ref 1.7–7.7)
Neutrophils Relative %: 45 %
Platelets: 237 10*3/uL (ref 150–400)
RBC: 4.52 MIL/uL (ref 3.87–5.11)
RDW: 13.9 % (ref 11.5–15.5)
WBC: 5.8 10*3/uL (ref 4.0–10.5)
nRBC: 0 % (ref 0.0–0.2)

## 2019-08-26 LAB — WET PREP, GENITAL
Sperm: NONE SEEN
Trich, Wet Prep: NONE SEEN

## 2019-08-26 LAB — I-STAT BETA HCG BLOOD, ED (MC, WL, AP ONLY): I-stat hCG, quantitative: <5 m[IU]/mL (ref <5)

## 2019-08-26 LAB — COMPREHENSIVE METABOLIC PANEL
ALT: 13 U/L (ref 0–44)
AST: 18 U/L (ref 15–41)
Albumin: 4.2 g/dL (ref 3.5–5.0)
Alkaline Phosphatase: 61 U/L (ref 38–126)
Anion gap: 8 (ref 5–15)
BUN: 10 mg/dL (ref 6–20)
CO2: 25 mmol/L (ref 22–32)
Calcium: 8.9 mg/dL (ref 8.9–10.3)
Chloride: 103 mmol/L (ref 98–111)
Creatinine, Ser: 0.72 mg/dL (ref 0.44–1.00)
GFR calc Af Amer: >60 mL/min (ref >60)
GFR calc non Af Amer: >60 mL/min (ref >60)
Glucose, Bld: 84 mg/dL (ref 70–99)
Potassium: 4.3 mmol/L (ref 3.5–5.1)
Sodium: 136 mmol/L (ref 135–145)
Total Bilirubin: 0.5 mg/dL (ref 0.3–1.2)
Total Protein: 7.8 g/dL (ref 6.5–8.1)

## 2019-08-26 LAB — URINALYSIS, ROUTINE W REFLEX MICROSCOPIC
Bilirubin Urine: NEGATIVE
Glucose, UA: NEGATIVE mg/dL
Hgb urine dipstick: NEGATIVE
Ketones, ur: NEGATIVE mg/dL
Nitrite: NEGATIVE
Protein, ur: 30 mg/dL — AB
Specific Gravity, Urine: 1.02 (ref 1.005–1.030)
WBC, UA: >50 WBC/hpf — ABNORMAL HIGH (ref 0–5)
pH: 6 (ref 5.0–8.0)

## 2019-08-26 LAB — LIPASE, BLOOD: Lipase: 31 U/L (ref 11–51)

## 2019-08-26 MED ORDER — FLUCONAZOLE 150 MG PO TABS: 150.0000 mg | ORAL_TABLET | Freq: Once | ORAL | 0 refills | Status: AC

## 2019-08-26 MED ORDER — ONDANSETRON HCL 4 MG PO TABS: 4.0000 mg | ORAL_TABLET | Freq: Three times a day (TID) | ORAL | 0 refills | Status: DC | PRN

## 2019-08-26 MED ORDER — METRONIDAZOLE 500 MG PO TABS: 500.0000 mg | ORAL_TABLET | Freq: Two times a day (BID) | ORAL | 0 refills | Status: AC

## 2019-08-26 MED ORDER — SODIUM CHLORIDE 0.9 % IV BOLUS
1000.0000 mL | Freq: Once | INTRAVENOUS | Status: AC
  Administered 2019-08-26: 1000 mL via INTRAVENOUS

## 2019-08-26 MED ORDER — ONDANSETRON HCL 4 MG/2ML IJ SOLN
4.0000 mg | Freq: Once | INTRAMUSCULAR | Status: AC
  Administered 2019-08-26: 4 mg via INTRAVENOUS
  Filled 2019-08-26: qty 2

## 2019-08-26 MED ORDER — FENTANYL CITRATE (PF) 100 MCG/2ML IJ SOLN
50.0000 ug | Freq: Once | INTRAMUSCULAR | Status: AC
  Administered 2019-08-26: 50 ug via INTRAVENOUS
  Filled 2019-08-26: qty 2

## 2019-08-26 NOTE — ED Notes (Signed)
Pelvic exam set up at bedside.  

## 2019-08-26 NOTE — ED Provider Notes (Signed)
[name] Provider Note   CSN: [id number] Arrival date & time: 08/26/19  0759     History Chief Complaint  Patient presents with  . Dizziness  . Vaginal Bleeding    Monica Armstrong is a 29 y.o. female.  The history is provided by the patient and medical records. No language interpreter was used.  Vaginal Bleeding Quality:  Dark red Severity:  Mild Onset quality:  Gradual Duration:  2 days Timing:  Intermittent Progression:  Waxing and waning Chronicity:  New Menstrual history:  Regular Possible pregnancy: no   Context: during urination   Context comment:  During BM Relieved by:  Nothing Worsened by:  Nothing Ineffective treatments:  None tried Associated symptoms: abdominal pain   Associated symptoms: no back pain, no dizziness, no dyspareunia, no dysuria, no fatigue, no fever, no nausea and no vaginal discharge   Associated symptoms comment:  Lightheaded Risk factors: ovarian cysts and PID (tx for PID last month)   Risk factors: no hx of ectopic pregnancy, no hx of endometriosis, no ovarian torsion and no prior miscarriage        Past Medical History:  Diagnosis Date  . GERD (gastroesophageal reflux disease)     There are no problems to display for this patient.   Past Surgical History:  Procedure Laterality Date  . NO PAST SURGERIES       OB History    Gravida  3   Para  0   Term  0   Preterm  0   AB  2   Living  0     SAB  1   TAB  1   Ectopic  0   Multiple  0   Live Births  0           Family History  Problem Relation Age of Onset  . Heart disease Paternal Grandmother   . Cancer Paternal Grandmother     Social History   Tobacco Use  . Smoking status: Never Smoker  . Smokeless tobacco: Never Used  Substance Use Topics  . Alcohol use: Yes    Alcohol/week: 0.0 standard drinks    Comment: occasionally  . Drug use: No    Home Medications Prior to Admission medications     Medication Sig Start Date End Date Taking? Authorizing Provider  ibuprofen (ADVIL) 600 MG tablet Take 1 tablet (600 mg total) by mouth every 6 (six) hours as needed. 03/30/19   Fayrene Helper, PA-C    Allergies    Patient has no known allergies.  Review of Systems   Review of Systems  Constitutional: Negative for chills, diaphoresis, fatigue and fever.  HENT: Negative for congestion.   Eyes: Negative for visual disturbance.  Respiratory: Negative for cough, chest tightness, shortness of breath and wheezing.   Cardiovascular: Negative for chest pain and palpitations.  Gastrointestinal: Positive for abdominal pain. Negative for abdominal distention, constipation, diarrhea, nausea and vomiting.  Genitourinary: Positive for pelvic pain and vaginal bleeding. Negative for dyspareunia, dysuria, enuresis, flank pain, vaginal discharge and vaginal pain.  Musculoskeletal: Negative for back pain.  Skin: Negative for rash and wound.  Neurological: Positive for light-headedness. Negative for dizziness, syncope, weakness and headaches.  Psychiatric/Behavioral: Negative for agitation and confusion.  All other systems reviewed and are negative.   Physical Exam Updated Vital Signs BP 104/78 (BP Location: Right Arm)   Pulse 67   Temp 97.9 F (36.6 C) (Oral)   Resp 18   LMP 07/31/2019  SpO2 99%   Physical Exam Vitals and nursing note reviewed. Exam conducted with a chaperone present.  Constitutional:      General: She is not in acute distress.    Appearance: Normal appearance. She is normal weight. She is not ill-appearing, toxic-appearing or diaphoretic.  HENT:     Head: Normocephalic.     Nose: No congestion or rhinorrhea.     Mouth/Throat:     Mouth: Mucous membranes are moist.     Pharynx: No oropharyngeal exudate or posterior oropharyngeal erythema.  Eyes:     Conjunctiva/sclera: Conjunctivae normal.     Pupils: Pupils are equal, round, and reactive to light.  Cardiovascular:      Rate and Rhythm: Normal rate.     Pulses: Normal pulses.     Heart sounds: No murmur.  Pulmonary:     Effort: Pulmonary effort is normal.     Breath sounds: No wheezing, rhonchi or rales.  Abdominal:     General: Abdomen is flat. There is no distension.     Tenderness: There is no right CVA tenderness, left CVA tenderness, guarding or rebound.  Genitourinary:    Comments: Pelvic exam revealed some discharge and some bleeding.  No cervical motion tenderness.  Mild right adnexal tenderness.  No left adnexal tenderness.  Musculoskeletal:        General: No tenderness.     Cervical back: No tenderness.  Skin:    Capillary Refill: Capillary refill takes less than 2 seconds.  Neurological:     Mental Status: She is alert and oriented to person, place, and time.  Psychiatric:        Mood and Affect: Mood normal.     ED Results / Procedures / Treatments   Labs (all labs ordered are listed, but only abnormal results are displayed) Labs Reviewed  WET PREP, GENITAL - Abnormal; Notable for the following components:      Result Value   Yeast Wet Prep HPF POC PRESENT (*)    Clue Cells Wet Prep HPF POC PRESENT (*)    WBC, Wet Prep HPF POC RARE (*)    All other components within normal limits  URINALYSIS, ROUTINE W REFLEX MICROSCOPIC - Abnormal; Notable for the following components:   Protein, ur 30 (*)    Leukocytes,Ua LARGE (*)    WBC, UA >50 (*)    Bacteria, UA FEW (*)    All other components within normal limits  URINE CULTURE  CBC WITH DIFFERENTIAL/PLATELET  COMPREHENSIVE METABOLIC PANEL  LIPASE, BLOOD  I-STAT BETA HCG BLOOD, ED ([facility], [facility], [facility] ONLY)  WET PREP  (BD AFFIRM) ([place])  GC/CHLAMYDIA PROBE AMP ([place]) NOT AT [facility]    EKG None  Radiology US PELVIC COMPLETE W TRANSVAGINAL AND TORSION R/O  Result Date: 08/26/2019 CLINICAL DATA:  Pelvic pain for 2 weeks EXAM: TRANSABDOMINAL AND TRANSVAGINAL ULTRASOUND OF PELVIS DOPPLER ULTRASOUND OF OVARIES TECHNIQUE: Both  transabdominal and transvaginal ultrasound examinations of the pelvis were performed. Transabdominal technique was performed for global imaging of the pelvis including uterus, ovaries, adnexal regions, and pelvic cul-de-sac. It was necessary to proceed with endovaginal exam following the transabdominal exam to visualize the uterus endometrium ovaries. Color and duplex Doppler ultrasound was utilized to evaluate blood flow to the ovaries. COMPARISON:  Ultrasound 07/27/2019, CT 07/27/2019 FINDINGS: Uterus Measurements: 7.8 x 3.1 x 4.1 cm = volume: 50.6 mL. No fibroids or other mass visualized. Endometrium Thickness: 3.2 mm.  No focal abnormality visualized. Right ovary Measurements: 3.2 x  2.1 x 3.2 cm = volume: 11.1 mL. Normal appearance/no adnexal mass. Left ovary Measurements: 3.4 x 1.7 x 2.7 cm = volume: 8.5 mL. Normal appearance/no adnexal mass. Pulsed Doppler evaluation of both ovaries demonstrates normal low-resistance arterial and venous waveforms. Other findings Small free fluid IMPRESSION: 1. Negative for ovarian torsion or ovarian mass lesion. 2. Trace free fluid in the pelvis Electronically Signed   By: Jasmine Pang M.D.   On: 08/26/2019 15:12    Procedures Procedures (including critical care time)  Medications Ordered in ED Medications  sodium chloride 0.9 % bolus 1,000 mL (0 mLs Intravenous Stopped 08/26/19 1457)  ondansetron (ZOFRAN) injection 4 mg (4 mg Intravenous Given 08/26/19 1244)  fentaNYL (SUBLIMAZE) injection 50 mcg (50 mcg Intravenous Given 08/26/19 1244)     ED Course  I have reviewed the triage vital signs and the nursing notes.  Pertinent labs & imaging results that were available during my care of the patient were reviewed by me and considered in my medical decision making (see chart for details).    MDM Rules/Calculators/A&P                      Monica Armstrong is a 29 y.o. female with a past medical history significant for GERD, recent we treated for possible PID and  found to have an ovarian cyst who presents with severe lower abdominal pain/pelvic pain and vaginal bleeding. Patient reports that last month she was treated for presumptive PID with antibiotics and had an ultrasound showing a right ovarian cyst. She reports that she did well and then a normal menstrual cycle at the end of the month. She says that starting yesterday she start having lightheadedness, nausea, and pelvic pain with vaginal bleeding. She reports that whenever she had a bowel movement or urination, she had very dark vaginal bleeding. She reports the pelvic pain feels slightly different than the pain she had last month. She denies any trauma. She reports some nausea no vomiting. She denies any constipation or diarrhea. No recent fevers, chills, congestion, cough, shortness of breath, or palpitations. She reports no upper abdominal tenderness. She denies other complaints.  On exam, lungs are clear and chest is nontender. Abdomen is nontender. We will do a pelvic exam with a chaperone. Flanks nontender, CVA areas nontender. Lungs clear. No murmur.  After pelvic exam, anticipate pelvic ultrasound to rule out torsion now that she has a known ovarian cyst with this pain. We will recheck for infection. Will get labs given the nausea and lightheadedness and blood pressure around 100 here. Will give fluids, pain medicine, nausea medicine.  Patient much better after medications.    Pelvic exam showed a discharge and some mild bleeding.  Minimal tenderness.  No CMT.  Mild tenderness on right adnexa where she had the previous cyst.  Ultrasound showed resolution of the cyst, suspected ruptured contribute to some of her bleeding and pain.  Wet prep showed yeast infection and BV.  Patient given antibiotics for these.  Other labs reassuring and patient is feeling much better.  She denies any urinary symptoms we will send culture for the leukocytes and bacteria.  Patient given prescription for Diflucan and  Flagyl and will follow up with PCP.  She will follow-up with her OB/GYN.  She understood return precautions and follow-up instructions.  Patient discharged in good condition.    Final Clinical Impression(s) / ED Diagnoses Final diagnoses:  Vaginal bleeding  BV (bacterial vaginosis)  Yeast infection  Lower  abdominal pain  Nausea    Rx / DC Orders ED Discharge Orders         Ordered    ondansetron (ZOFRAN) 4 MG tablet  Every 8 hours PRN     08/26/19 1644    fluconazole (DIFLUCAN) 150 MG tablet   Once     08/26/19 1644    metroNIDAZOLE (FLAGYL) 500 MG tablet  2 times daily     08/26/19 1644         Clinical Impression: 1. Vaginal bleeding   2. Pelvic pain   3. BV (bacterial vaginosis)   4. Yeast infection   5. Lower abdominal pain   6. Nausea     Disposition: Discharge  Condition: Good  I have discussed the results, Dx and Tx plan with the pt(& family if present). He/she/they expressed understanding and agree(s) with the plan. Discharge instructions discussed at great length. Strict return precautions discussed and pt &/or family have verbalized understanding of the instructions. No further questions at time of discharge.    Discharge Medication List as of 08/26/2019  4:46 PM    START taking these medications   Details  fluconazole (DIFLUCAN) 150 MG tablet Take 1 tablet (150 mg total) by mouth once for 1 dose., Starting Fri 08/26/2019, Print    metroNIDAZOLE (FLAGYL) 500 MG tablet Take 1 tablet (500 mg total) by mouth 2 (two) times daily for 7 days., Starting Fri 08/26/2019, Until Fri 09/02/2019, Print    ondansetron (ZOFRAN) 4 MG tablet Take 1 tablet (4 mg total) by mouth every 8 (eight) hours as needed for nausea or vomiting., Starting Fri 08/26/2019, Print        Follow Up: [facility], [facility] [address] [place] [address] [phone]     [facility] [address] [id number] [place] [address] [phone]       Monesha Monreal, Canary Brim, MD 08/26/19 [phone]

## 2019-08-26 NOTE — ED Notes (Signed)
Light green and lav sent to lab in save tubes.

## 2019-08-26 NOTE — ED Triage Notes (Signed)
C/o dizziness X2 day C/O nausea X1 day C/o lower abdominal cramping   LMP Dec 20th   Patient reports she has been spotting for 2 weeks now (dark and brown) from vaginal area.   Hx. Cyst in right ovary per patient    Patient states she took 2 pregnancy test at home that were negative.    A/ox4 Ambulatory in triage   Patient reports she is not on BC anymore and stopped taking the injection about 8 months ago

## 2019-08-26 NOTE — Discharge Instructions (Signed)
Your work-up today confirmed both a bacterial vaginosis infection and yeast infection.  Please take the antibiotics to treat these.  We suspect your vaginal bleeding and abdominal pain are related to a likely ruptured ovarian cyst that you had previously seen on ultrasound that is now gone.  You are not pregnant and your labs were otherwise reassuring.  Please rest and stay hydrated.  Please use the nausea medicine to help with this.  You may use over-the-counter medication stable discomfort.  If any symptoms change or worsen, please return to nearest emergency department and please follow-up with your OB/GYN as we discussed.

## 2019-08-28 LAB — URINE CULTURE: Culture: >=100000 — AB

## 2019-08-29 ENCOUNTER — Telehealth: Payer: Self-pay | Admitting: Emergency Medicine

## 2019-08-29 LAB — GC/CHLAMYDIA PROBE AMP (~~LOC~~) NOT AT ARMC
Chlamydia: NEGATIVE
Neisseria Gonorrhea: NEGATIVE

## 2019-08-29 NOTE — Telephone Encounter (Signed)
Post ED Visit - Positive Culture Follow-up  Culture report reviewed by antimicrobial stewardship pharmacist: [facility] []  , Pharm.D. []  Enzo Bi, Pharm.D., BCPS AQ-ID []  , Pharm.D., BCPS []  Celedonio Miyamoto, Pharm.D., BCPS []  [place], Garvin Fila.D., BCPS, AAHIVP []  , Pharm.D., BCPS, AAHIVP []  Georgina Pillion, PharmD, BCPS []  , PharmD, BCPS []  [place], PharmD, BCPS []  [place], PharmD []  , PharmD, BCPS []  Estella Husk, PharmD  Pharmacy Team []  Lysle Pearl, PharmD []  , PharmD []  Phillips Climes, PharmD []  , Rph []  Agapito Games) , PharmD []  Verlan Friends, PharmD []  , PharmD []  Mervyn Gay, PharmD []  , PharmD []  Vinnie Level, PharmD []  [facility], PharmD []  , PharmD []  Len Childs, PharmD   Positive urine culture Treated with none, asymptomaticno further patient follow-up is required at this time.  08/29/2019, 12:09 PM

## 2019-11-04 ENCOUNTER — Encounter (HOSPITAL_COMMUNITY): Payer: Self-pay

## 2019-11-04 ENCOUNTER — Emergency Department (HOSPITAL_COMMUNITY)
Admission: EM | Admit: 2019-11-04 | Discharge: 2019-11-04 | Disposition: A | Discharge status: Home / Self Care | Payer: Self-pay | Attending: Emergency Medicine | Admitting: Emergency Medicine

## 2019-11-04 ENCOUNTER — Other Ambulatory Visit: Payer: Self-pay

## 2019-11-04 DIAGNOSIS — T7840XA Allergy, unspecified, initial encounter: Secondary | ICD-10-CM

## 2019-11-04 NOTE — Discharge Instructions (Addendum)
Use Benadryl as directed if your symptoms return.  Return here for any problems

## 2019-11-04 NOTE — ED Provider Notes (Signed)
[facility]-EMERGENCY DEPT Provider Note   CSN: [id number] Arrival date & time: 11/04/19  1652     History No chief complaint on file.   Monica Armstrong is a 29 y.o. female.  29 year old female who presents after having swelling to the right side of her face after eating a [employer] sandwich.  Noted some tingling to her lip.  Denies any rash.  No pruritus.  No trouble swallowing or breathing.  Symptoms resolved spontaneously.  Denies any new chemical exposures.  No treatment use prior to arrival.        Past Medical History:  Diagnosis Date  . GERD (gastroesophageal reflux disease)     There are no problems to display for this patient.   Past Surgical History:  Procedure Laterality Date  . NO PAST SURGERIES       OB History    Gravida  3   Para  0   Term  0   Preterm  0   AB  2   Living  0     SAB  1   TAB  1   Ectopic  0   Multiple  0   Live Births  0           Family History  Problem Relation Age of Onset  . Heart disease Paternal Grandmother   . Cancer Paternal Grandmother     Social History   Tobacco Use  . Smoking status: Never Smoker  . Smokeless tobacco: Never Used  Substance Use Topics  . Alcohol use: Yes    Alcohol/week: 0.0 standard drinks    Comment: occasionally  . Drug use: No    Home Medications Prior to Admission medications   Medication Sig Start Date End Date Taking? Authorizing Provider  ibuprofen (ADVIL) 600 MG tablet Take 1 tablet (600 mg total) by mouth every 6 (six) hours as needed. Patient not taking: Reported on 08/26/2019 03/30/19   Fayrene Helper, PA-C  ondansetron (ZOFRAN) 4 MG tablet Take 1 tablet (4 mg total) by mouth every 8 (eight) hours as needed for nausea or vomiting. 08/26/19   Tegeler, Canary Brim, MD    Allergies    Patient has no known allergies.  Review of Systems   Review of Systems  All other systems reviewed and are negative.   Physical Exam Updated Vital Signs BP  111/81 (BP Location: Right Arm)   Pulse (!) 103   Temp 98.6 F (37 C) (Oral)   Resp 14   Ht 1.499 m (4\' 11" )   Wt 54 kg   SpO2 97%   BMI 24.04 kg/m   Physical Exam Vitals and nursing note reviewed.  Constitutional:      General: She is not in acute distress.    Appearance: Normal appearance. She is well-developed. She is not toxic-appearing.  HENT:     Head: Normocephalic and atraumatic.  Eyes:     General: Lids are normal.     Conjunctiva/sclera: Conjunctivae normal.     Pupils: Pupils are equal, round, and reactive to light.  Neck:     Thyroid: No thyroid mass.     Trachea: No tracheal deviation.  Cardiovascular:     Rate and Rhythm: Normal rate and regular rhythm.     Heart sounds: Normal heart sounds. No murmur. No gallop.   Pulmonary:     Effort: Pulmonary effort is normal. No respiratory distress.     Breath sounds: Normal breath sounds. No stridor. No decreased breath sounds,  wheezing, rhonchi or rales.  Abdominal:     General: Bowel sounds are normal. There is no distension.     Palpations: Abdomen is soft.     Tenderness: There is no abdominal tenderness. There is no rebound.  Musculoskeletal:        General: No tenderness. Normal range of motion.     Cervical back: Normal range of motion and neck supple.  Skin:    General: Skin is warm and dry.     Findings: No abrasion or rash.  Neurological:     Mental Status: She is alert and oriented to person, place, and time.     GCS: GCS eye subscore is 4. GCS verbal subscore is 5. GCS motor subscore is 6.     Cranial Nerves: No cranial nerve deficit.     Sensory: No sensory deficit.  Psychiatric:        Speech: Speech normal.        Behavior: Behavior normal.     ED Results / Procedures / Treatments   Labs (all labs ordered are listed, but only abnormal results are displayed) Labs Reviewed - No data to display  EKG None  Radiology No results found.  Procedures Procedures (including critical care  time)  Medications Ordered in ED Medications - No data to display  ED Course  I have reviewed the triage vital signs and the nursing notes.  Pertinent labs & imaging results that were available during my care of the patient were reviewed by me and considered in my medical decision making (see chart for details).    MDM Rules/Calculators/A&P                      No evidence of allergic reaction here at this time.  Patient directed to take Benadryl as directed and return precautions given Final Clinical Impression(s) / ED Diagnoses Final diagnoses:  None    Rx / DC Orders ED Discharge Orders    None       Lacretia Leigh, MD 11/04/19 1744

## 2019-11-04 NOTE — ED Triage Notes (Signed)
Patient was eating a BLT sandwich from [employer] 3x hours ago and R side bottom lip started swelling immediately. Patient states that 30 minutes later she started having R sided face and shoulder pain which is why she came to the ER. No known allergies and this is the first time it has happened to patient. No complaints of hives, chest pain, or SOB.

## 2020-01-12 ENCOUNTER — Other Ambulatory Visit: Payer: Self-pay

## 2020-01-12 ENCOUNTER — Emergency Department (HOSPITAL_COMMUNITY): Payer: BLUE CROSS/BLUE SHIELD

## 2020-01-12 ENCOUNTER — Encounter (HOSPITAL_COMMUNITY): Payer: Self-pay | Admitting: Emergency Medicine

## 2020-01-12 ENCOUNTER — Emergency Department (HOSPITAL_COMMUNITY)
Admission: EM | Admit: 2020-01-12 | Discharge: 2020-01-12 | Disposition: A | Discharge status: Home / Self Care | Payer: BLUE CROSS/BLUE SHIELD | Attending: Emergency Medicine | Admitting: Emergency Medicine

## 2020-01-12 DIAGNOSIS — N83209 Unspecified ovarian cyst, unspecified side: Secondary | ICD-10-CM

## 2020-01-12 DIAGNOSIS — N83201 Unspecified ovarian cyst, right side: Secondary | ICD-10-CM | POA: Insufficient documentation

## 2020-01-12 LAB — HIV ANTIBODY (ROUTINE TESTING W REFLEX): HIV Screen 4th Generation wRfx: NONREACTIVE

## 2020-01-12 LAB — COMPREHENSIVE METABOLIC PANEL
ALT: 14 U/L (ref 0–44)
AST: 17 U/L (ref 15–41)
Albumin: 4 g/dL (ref 3.5–5.0)
Alkaline Phosphatase: 61 U/L (ref 38–126)
Anion gap: 9 (ref 5–15)
BUN: 7 mg/dL (ref 6–20)
CO2: 22 mmol/L (ref 22–32)
Calcium: 9 mg/dL (ref 8.9–10.3)
Chloride: 105 mmol/L (ref 98–111)
Creatinine, Ser: 0.67 mg/dL (ref 0.44–1.00)
GFR calc Af Amer: >60 mL/min (ref >60)
GFR calc non Af Amer: >60 mL/min (ref >60)
Glucose, Bld: 75 mg/dL (ref 70–99)
Potassium: 3.9 mmol/L (ref 3.5–5.1)
Sodium: 136 mmol/L (ref 135–145)
Total Bilirubin: 0.5 mg/dL (ref 0.3–1.2)
Total Protein: 7.5 g/dL (ref 6.5–8.1)

## 2020-01-12 LAB — CBC
HCT: 45 % (ref 36.0–46.0)
Hemoglobin: 15 g/dL (ref 12.0–15.0)
MCH: 30.2 pg (ref 26.0–34.0)
MCHC: 33.3 g/dL (ref 30.0–36.0)
MCV: 90.5 fL (ref 80.0–100.0)
Platelets: 288 10*3/uL (ref 150–400)
RBC: 4.97 MIL/uL (ref 3.87–5.11)
RDW: 13.2 % (ref 11.5–15.5)
WBC: 8.5 10*3/uL (ref 4.0–10.5)
nRBC: 0 % (ref 0.0–0.2)

## 2020-01-12 LAB — WET PREP, GENITAL
Clue Cells Wet Prep HPF POC: NONE SEEN
Sperm: NONE SEEN
Trich, Wet Prep: NONE SEEN
Yeast Wet Prep HPF POC: NONE SEEN

## 2020-01-12 LAB — URINALYSIS, ROUTINE W REFLEX MICROSCOPIC
Bilirubin Urine: NEGATIVE
Glucose, UA: NEGATIVE mg/dL
Hgb urine dipstick: NEGATIVE
Ketones, ur: 5 mg/dL — AB
Nitrite: NEGATIVE
Protein, ur: NEGATIVE mg/dL
Specific Gravity, Urine: 1.016 (ref 1.005–1.030)
pH: 8 (ref 5.0–8.0)

## 2020-01-12 LAB — I-STAT BETA HCG BLOOD, ED (MC, WL, AP ONLY): I-stat hCG, quantitative: <5 m[IU]/mL (ref <5)

## 2020-01-12 LAB — LIPASE, BLOOD: Lipase: 23 U/L (ref 11–51)

## 2020-01-12 MED ORDER — ACETAMINOPHEN 325 MG PO TABS
650.0000 mg | ORAL_TABLET | Freq: Once | ORAL | Status: AC
  Administered 2020-01-12: 650 mg via ORAL
  Filled 2020-01-12: qty 2

## 2020-01-12 MED ORDER — NAPROXEN 500 MG PO TABS: 500.0000 mg | ORAL_TABLET | Freq: Two times a day (BID) | ORAL | 0 refills | Status: DC | PRN

## 2020-01-12 MED ORDER — SODIUM CHLORIDE 0.9 % IV BOLUS
1000.0000 mL | Freq: Once | INTRAVENOUS | Status: AC
  Administered 2020-01-12: 1000 mL via INTRAVENOUS

## 2020-01-12 MED ORDER — KETOROLAC TROMETHAMINE 15 MG/ML IJ SOLN
15.0000 mg | Freq: Once | INTRAMUSCULAR | Status: AC
  Administered 2020-01-12: 15 mg via INTRAVENOUS
  Filled 2020-01-12: qty 1

## 2020-01-12 MED ORDER — SODIUM CHLORIDE 0.9% FLUSH: 3.0000 mL | Freq: Once | INTRAVENOUS | Status: DC

## 2020-01-12 MED ORDER — FENTANYL CITRATE (PF) 100 MCG/2ML IJ SOLN
50.0000 ug | Freq: Once | INTRAMUSCULAR | Status: AC
  Administered 2020-01-12: 50 ug via INTRAVENOUS
  Filled 2020-01-12: qty 2

## 2020-01-12 NOTE — Discharge Instructions (Addendum)
You were seen in the emergency department today for abdominal pain.  Your pregnancy test was negative.  Your labs were overall reassuring.  Your ultrasound showed findings of a right-sided hemorrhagic ovarian cyst which we suspect is the cause of your pain.  We are sending you home with naproxen to help with this.  - Naproxen is a nonsteroidal anti-inflammatory medication that will help with pain and swelling. Be sure to take this medication as prescribed with food, 1 pill every 12 hours,  It should be taken with food, as it can cause stomach upset, and more seriously, stomach bleeding. Do not take other nonsteroidal anti-inflammatory medications with this such as Advil, Motrin, Aleve, Mobic, Goodie Powder, or Motrin.    You make take Tylenol per over the counter dosing with these medications.   We have prescribed you new medication(s) today. Discuss the medications prescribed today with your pharmacist as they can have adverse effects and interactions with your other medicines including over the counter and prescribed medications. Seek medical evaluation if you start to experience new or abnormal symptoms after taking one of these medicines, seek care immediately if you start to experience difficulty breathing, feeling of your throat closing, facial swelling, or rash as these could be indications of a more serious allergic reaction   Please follow-up with your OB/GYN within 1 week for reevaluation.  Return to the ER for new or worsening symptoms including but not limited to worsening pain, fever, inability to keep fluids down, or any other concerns.

## 2020-01-12 NOTE — ED Provider Notes (Signed)
[facility] DEPT Provider Note   CSN: [id number] Arrival date & time: 01/12/20  0350     History Chief Complaint  Patient presents with  . Abdominal Pain    Monica Armstrong is a 29 y.o. female with a history of GERD who presents to the ED with complaints of abdominal pain for the past 3 days. Patient states the pain is waxing and waning to the right side of her pelvis, no specific alleviating or aggravating factors, no intervention prior to arrival. She had a positive pregnancy test at home within the past 1 week. G3A2P0. Last menstrual period was 12/22/2019. She is currently sexually active in a monogamous relationship. She denies fever, chills, nausea, vomiting, diarrhea, constipation, dysuria, vaginal bleeding, or vaginal discharge.  HPI     Past Medical History:  Diagnosis Date  . GERD (gastroesophageal reflux disease)     There are no problems to display for this patient.   Past Surgical History:  Procedure Laterality Date  . NO PAST SURGERIES       OB History    Gravida  3   Para  0   Term  0   Preterm  0   AB  2   Living  0     SAB  1   TAB  1   Ectopic  0   Multiple  0   Live Births  0           Family History  Problem Relation Age of Onset  . Heart disease Paternal Grandmother   . Cancer Paternal Grandmother     Social History   Tobacco Use  . Smoking status: Never Smoker  . Smokeless tobacco: Never Used  Substance Use Topics  . Alcohol use: Yes    Alcohol/week: 0.0 standard drinks    Comment: occasionally  . Drug use: No    Home Medications Prior to Admission medications   Medication Sig Start Date End Date Taking? Authorizing Provider  ibuprofen (ADVIL) 600 MG tablet Take 1 tablet (600 mg total) by mouth every 6 (six) hours as needed. Patient not taking: Reported on 08/26/2019 03/30/19   Domenic Moras, PA-C  ondansetron (ZOFRAN) 4 MG tablet Take 1 tablet (4 mg total) by mouth every 8 (eight) hours  as needed for nausea or vomiting. 08/26/19   Tegeler, Gwenyth Allegra, MD    Allergies    Patient has no known allergies.  Review of Systems   Review of Systems  Constitutional: Negative for chills and fever.  Respiratory: Negative for shortness of breath.   Cardiovascular: Negative for chest pain.  Gastrointestinal: Positive for abdominal pain. Negative for blood in stool, constipation, diarrhea, nausea and vomiting.  Genitourinary: Negative for dysuria, vaginal bleeding and vaginal discharge.  Neurological: Negative for syncope.  All other systems reviewed and are negative.   Physical Exam Updated Vital Signs BP 112/78   Pulse 82   Temp 98.3 F (36.8 C) (Oral)   Resp 17   SpO2 100%   Physical Exam Vitals and nursing note reviewed.  Constitutional:      General: She is not in acute distress.    Appearance: She is well-developed. She is not toxic-appearing.  HENT:     Head: Normocephalic and atraumatic.  Eyes:     General:        Right eye: No discharge.        Left eye: No discharge.     Conjunctiva/sclera: Conjunctivae normal.  Cardiovascular:  Rate and Rhythm: Normal rate and regular rhythm.  Pulmonary:     Effort: Pulmonary effort is normal. No respiratory distress.     Breath sounds: Normal breath sounds. No wheezing, rhonchi or rales.  Abdominal:     General: There is no distension.     Palpations: Abdomen is soft.     Tenderness: There is abdominal tenderness (R>L suprapubic tenderness) in the suprapubic area. There is no guarding or rebound. Negative signs include Murphy's sign, Rovsing's sign, McBurney's sign, psoas sign and obturator sign.  Genitourinary:    Comments: Thin white discharge noted in the vaginal vault. No bleeding. Os is closed. Patient has right adnexal tenderness without palpable masses. Musculoskeletal:     Cervical back: Neck supple.  Skin:    General: Skin is warm and dry.     Findings: No rash.  Neurological:     Mental Status: She  is alert.     Comments: Clear speech.   Psychiatric:        Behavior: Behavior normal.    ED Results / Procedures / Treatments   Labs (all labs ordered are listed, but only abnormal results are displayed) Labs Reviewed  WET PREP, GENITAL - Abnormal; Notable for the following components:      Result Value   WBC, Wet Prep HPF POC FEW (*)    All other components within normal limits  URINALYSIS, ROUTINE W REFLEX MICROSCOPIC - Abnormal; Notable for the following components:   APPearance HAZY (*)    Ketones, ur 5 (*)    Leukocytes,Ua TRACE (*)    Bacteria, UA FEW (*)    All other components within normal limits  LIPASE, BLOOD  COMPREHENSIVE METABOLIC PANEL  CBC  HIV ANTIBODY (ROUTINE TESTING W REFLEX)  RPR  I-STAT BETA HCG BLOOD, ED ([facility], [facility], [facility] ONLY)  GC/CHLAMYDIA PROBE AMP ([place]) NOT AT [facility]    EKG None  Radiology US Transvaginal Non-OB  Result Date: 01/12/2020 CLINICAL DATA:  Pelvic pain EXAM: TRANSABDOMINAL AND TRANSVAGINAL ULTRASOUND OF PELVIS DOPPLER ULTRASOUND OF OVARIES TECHNIQUE: Both transabdominal and transvaginal ultrasound examinations of the pelvis were performed. Transabdominal technique was performed for global imaging of the pelvis including uterus, ovaries, adnexal regions, and pelvic cul-de-sac. It was necessary to proceed with endovaginal exam following the transabdominal exam to visualize the adnexa. Color and duplex Doppler ultrasound was utilized to evaluate blood flow to the ovaries. COMPARISON:  Ultrasound 08/26/2019 FINDINGS: Uterus Measurements: 7.1 x 3.0 x 4.1 cm = volume: 45 mL. No fibroids or other mass visualized. Endometrium Thickness: 5 mm.  No focal abnormality visualized. Right ovary Measurements: 3.9 x 3.3 x 2.8 cm. 2.2 cm right ovarian cyst with reticular internal echoes and a small eccentric solid-appearing area without vascularity, most compatible with a hemorrhagic cyst. Right ovary is otherwise normal in appearance. No adnexal mass. Left  ovary Measurements: 2.7 x 2.4 x 2.8 cm. Normal appearance/no adnexal mass. Pulsed Doppler evaluation of both ovaries demonstrates normal low-resistance arterial and venous waveforms. Other findings Trace free fluid within the pelvis. IMPRESSION: 1. Negative for adnexal torsion. 2. Complex 2.2 cm right ovarian cyst, with imaging features most compatible with a hemorrhagic cyst. No imaging follow-up of this finding is required. This recommendation follows the consensus statement: Management of Asymptomatic Ovarian and Other Adnexal Cysts Imaged at [place]: Society of Radiologists in Ultrasound Consensus Conference Statement. Radiology 2010; [phone]. Electronically Signed   By: Duanne Guess D.O.   On: 01/12/2020 13:39   US Pelvis Complete  Result  Date: 01/12/2020 CLINICAL DATA:  Pelvic pain EXAM: TRANSABDOMINAL AND TRANSVAGINAL ULTRASOUND OF PELVIS DOPPLER ULTRASOUND OF OVARIES TECHNIQUE: Both transabdominal and transvaginal ultrasound examinations of the pelvis were performed. Transabdominal technique was performed for global imaging of the pelvis including uterus, ovaries, adnexal regions, and pelvic cul-de-sac. It was necessary to proceed with endovaginal exam following the transabdominal exam to visualize the adnexa. Color and duplex Doppler ultrasound was utilized to evaluate blood flow to the ovaries. COMPARISON:  Ultrasound 08/26/2019 FINDINGS: Uterus Measurements: 7.1 x 3.0 x 4.1 cm = volume: 45 mL. No fibroids or other mass visualized. Endometrium Thickness: 5 mm.  No focal abnormality visualized. Right ovary Measurements: 3.9 x 3.3 x 2.8 cm. 2.2 cm right ovarian cyst with reticular internal echoes and a small eccentric solid-appearing area without vascularity, most compatible with a hemorrhagic cyst. Right ovary is otherwise normal in appearance. No adnexal mass. Left ovary Measurements: 2.7 x 2.4 x 2.8 cm. Normal appearance/no adnexal mass. Pulsed Doppler evaluation of both ovaries demonstrates normal  low-resistance arterial and venous waveforms. Other findings Trace free fluid within the pelvis. IMPRESSION: 1. Negative for adnexal torsion. 2. Complex 2.2 cm right ovarian cyst, with imaging features most compatible with a hemorrhagic cyst. No imaging follow-up of this finding is required. This recommendation follows the consensus statement: Management of Asymptomatic Ovarian and Other Adnexal Cysts Imaged at [place]: Society of Radiologists in Ultrasound Consensus Conference Statement. Radiology 2010; [phone]. Electronically Signed   By: Duanne Guess D.O.   On: 01/12/2020 13:39   [place] Art/Ven Flow Abd Pelv Doppler  Result Date: 01/12/2020 CLINICAL DATA:  Pelvic pain EXAM: TRANSABDOMINAL AND TRANSVAGINAL ULTRASOUND OF PELVIS DOPPLER ULTRASOUND OF OVARIES TECHNIQUE: Both transabdominal and transvaginal ultrasound examinations of the pelvis were performed. Transabdominal technique was performed for global imaging of the pelvis including uterus, ovaries, adnexal regions, and pelvic cul-de-sac. It was necessary to proceed with endovaginal exam following the transabdominal exam to visualize the adnexa. Color and duplex Doppler ultrasound was utilized to evaluate blood flow to the ovaries. COMPARISON:  Ultrasound 08/26/2019 FINDINGS: Uterus Measurements: 7.1 x 3.0 x 4.1 cm = volume: 45 mL. No fibroids or other mass visualized. Endometrium Thickness: 5 mm.  No focal abnormality visualized. Right ovary Measurements: 3.9 x 3.3 x 2.8 cm. 2.2 cm right ovarian cyst with reticular internal echoes and a small eccentric solid-appearing area without vascularity, most compatible with a hemorrhagic cyst. Right ovary is otherwise normal in appearance. No adnexal mass. Left ovary Measurements: 2.7 x 2.4 x 2.8 cm. Normal appearance/no adnexal mass. Pulsed Doppler evaluation of both ovaries demonstrates normal low-resistance arterial and venous waveforms. Other findings Trace free fluid within the pelvis. IMPRESSION: 1. Negative  for adnexal torsion. 2. Complex 2.2 cm right ovarian cyst, with imaging features most compatible with a hemorrhagic cyst. No imaging follow-up of this finding is required. This recommendation follows the consensus statement: Management of Asymptomatic Ovarian and Other Adnexal Cysts Imaged at [place]: Society of Radiologists in Ultrasound Consensus Conference Statement. Radiology 2010; [id number]. Electronically Signed   By: Duanne Guess D.O.   On: 01/12/2020 13:39    Procedures Procedures (including critical care time)  Medications Ordered in ED Medications  sodium chloride flush (NS) 0.9 % injection 3 mL ( Intravenous Canceled Entry 01/12/20 1136)  ketorolac (TORADOL) 15 MG/ML injection 15 mg (has no administration in time range)  sodium chloride 0.9 % bolus 1,000 mL (1,000 mLs Intravenous New Bag/Given 01/12/20 1251)  acetaminophen (TYLENOL) tablet 650 mg (650 mg Oral Given 01/12/20 1251)  fentaNYL (SUBLIMAZE) injection 50 mcg (50 mcg Intravenous Given 01/12/20 1251)    ED Course  I have reviewed the triage vital signs and the nursing notes.  Pertinent labs & imaging results that were available during my care of the patient were reviewed by me and considered in my medical decision making (see chart for details).    MDM Rules/Calculators/A&P                     Patient presents to the ED with complaints of pelvic pain with positive pregnancy test at home. Patient is nontoxic, resting comfortably, vitals within normal limits. On exam patient has suprapubic abdominal tenderness that is worse on the right side as well as right adnexal tenderness. Mild discharge noted.   Ddx: Ectopic pregnancy, ovarian cyst, ovarian torsion, appendicitis, pain in pregnancy, menstrual cramps.  Additional history obtained:  Additional history obtained from review of nursing notes and chart.  Lab Tests:  I Ordered, reviewed, and interpreted labs, which included:  Pregnancy test is negative  CBC: No anemia or  leukocytosis  CMP: No significant electrolyte derangement, LFTs and renal function preserved.  Lipase: Within normal limits  Wet prep: No BV, yeast, or trichomoniasis.  GC/chlamydia pending  Urinalysis: Trace leuks, few bacteria, no urinary symptoms therefore do not feel treatment is necessary.  Imaging Studies ordered:  I ordered imaging studies which included pelvic ultrasound to rule out torsion, I independently visualized and interpreted imaging which showed 1. Negative for adnexal torsion. 2. Complex 2.2 cm right ovarian cyst, with imaging features most compatible with a hemorrhagic cyst. No imaging follow-up of this finding is required. This recommendation follows the consensus statement: Management of Asymptomatic Ovarian and Other Adnexal Cysts Imaged at [place]: Society of Radiologists in Ultrasound Consensus Conference Statement. Radiology 2010; [phone].  ED Course:  I have ordered fluids and Tylenol.  Once negative pregnancy test was obtained fentanyl ordered for pain as well.  14:00: RE-EVAL: Patient feeling improved, she remains with some discomfort, will give Toradol, and plan for discharge home. Suspect that her pain is from her right sided hemorrhagic cyst. She does not have any signs of torsion on ultrasound. She has no McBurney's point tenderness as well as negative psoas/obturator therefore do not feel that appendicitis is likely. Will discharge with naproxen and OB/GYN follow-up. I discussed results, treatment plan, need for follow-up, and return precautions with the patient. Provided opportunity for questions, patient confirmed understanding and is in agreement with plan.   Portions of this note were generated with Scientist, clinical (histocompatibility and immunogenetics). Dictation errors may occur despite best attempts at proofreading.    Final Clinical Impression(s) / ED Diagnoses Final diagnoses:  Hemorrhagic ovarian cyst    Rx / DC Orders ED Discharge Orders         Ordered    naproxen (NAPROSYN)  500 MG tablet  2 times daily PRN     01/12/20 1422           Ibrahem Volkman, [place] R, PA-C 01/12/20 1433    Milagros Loll, MD 01/13/20 [phone]

## 2020-01-12 NOTE — ED Triage Notes (Signed)
C/o right side abd pains for 3 days. Reports took home pregnancy test which was positive. Denies n/v/d or urinary problems.

## 2020-01-13 LAB — GC/CHLAMYDIA PROBE AMP (~~LOC~~) NOT AT ARMC
Chlamydia: NEGATIVE
Comment: NEGATIVE
Comment: NORMAL
Neisseria Gonorrhea: NEGATIVE

## 2020-01-13 LAB — RPR: RPR Ser Ql: NONREACTIVE

## 2020-01-17 ENCOUNTER — Emergency Department (HOSPITAL_COMMUNITY)
Admission: EM | Admit: 2020-01-17 | Discharge: 2020-01-17 | Disposition: A | Discharge status: Home / Self Care | Payer: Self-pay | Attending: Emergency Medicine | Admitting: Emergency Medicine

## 2020-01-17 ENCOUNTER — Encounter (HOSPITAL_COMMUNITY): Payer: Self-pay | Admitting: Emergency Medicine

## 2020-01-17 DIAGNOSIS — Y9289 Other specified places as the place of occurrence of the external cause: Secondary | ICD-10-CM | POA: Insufficient documentation

## 2020-01-17 DIAGNOSIS — Y9389 Activity, other specified: Secondary | ICD-10-CM | POA: Insufficient documentation

## 2020-01-17 DIAGNOSIS — M545 Low back pain, unspecified: Secondary | ICD-10-CM

## 2020-01-17 DIAGNOSIS — Y999 Unspecified external cause status: Secondary | ICD-10-CM | POA: Insufficient documentation

## 2020-01-17 DIAGNOSIS — M7918 Myalgia, other site: Secondary | ICD-10-CM | POA: Insufficient documentation

## 2020-01-17 DIAGNOSIS — M79604 Pain in right leg: Secondary | ICD-10-CM | POA: Insufficient documentation

## 2020-01-17 MED ORDER — NAPROXEN 500 MG PO TABS: 500.0000 mg | ORAL_TABLET | Freq: Two times a day (BID) | ORAL | 0 refills | Status: AC | PRN

## 2020-01-17 MED ORDER — METHOCARBAMOL 500 MG PO TABS: 500.0000 mg | ORAL_TABLET | Freq: Every evening | ORAL | 0 refills | Status: AC | PRN

## 2020-01-17 NOTE — Discharge Instructions (Signed)
Take naproxen 2 times a day with meals.  Do not take other anti-inflammatories at the same time (Advil, Motrin, ibuprofen, Aleve). You may supplement with Tylenol if you need further pain control. Use robaxin as needed for muscle stiffness or soreness.  Have caution, this may make you tired or groggy.  Do not drive or operate heavy machinery while taking this medicine. Use ice packs or heating pads if this helps control your pain. You will likely have continued muscle stiffness and soreness over the next couple days.  Follow-up in 1 week if your symptoms are not improving. Return to the emergency room if you develop vision changes, vomiting, slurred speech, numbness, loss of bowel or bladder control, or any new or worsening symptoms.
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
## 2020-02-19 ENCOUNTER — Other Ambulatory Visit: Payer: Self-pay

## 2020-02-19 ENCOUNTER — Emergency Department (HOSPITAL_COMMUNITY)
Admission: EM | Admit: 2020-02-19 | Discharge: 2020-02-19 | Disposition: A | Discharge status: Home / Self Care | Payer: BLUE CROSS/BLUE SHIELD | Attending: Emergency Medicine | Admitting: Emergency Medicine

## 2020-02-19 DIAGNOSIS — R1030 Lower abdominal pain, unspecified: Secondary | ICD-10-CM | POA: Diagnosis not present

## 2020-02-19 DIAGNOSIS — Z5321 Procedure and treatment not carried out due to patient leaving prior to being seen by health care provider: Secondary | ICD-10-CM | POA: Insufficient documentation

## 2020-02-19 LAB — COMPREHENSIVE METABOLIC PANEL
ALT: 13 U/L (ref 0–44)
AST: 17 U/L (ref 15–41)
Albumin: 3.6 g/dL (ref 3.5–5.0)
Alkaline Phosphatase: 54 U/L (ref 38–126)
Anion gap: 10 (ref 5–15)
BUN: 6 mg/dL (ref 6–20)
CO2: 20 mmol/L — ABNORMAL LOW (ref 22–32)
Calcium: 9.1 mg/dL (ref 8.9–10.3)
Chloride: 109 mmol/L (ref 98–111)
Creatinine, Ser: 0.61 mg/dL (ref 0.44–1.00)
GFR calc Af Amer: >60 mL/min (ref >60)
GFR calc non Af Amer: >60 mL/min (ref >60)
Glucose, Bld: 81 mg/dL (ref 70–99)
Potassium: 3.8 mmol/L (ref 3.5–5.1)
Sodium: 139 mmol/L (ref 135–145)
Total Bilirubin: 0.2 mg/dL — ABNORMAL LOW (ref 0.3–1.2)
Total Protein: 7.1 g/dL (ref 6.5–8.1)

## 2020-02-19 LAB — CBC
HCT: 42.5 % (ref 36.0–46.0)
Hemoglobin: 13.9 g/dL (ref 12.0–15.0)
MCH: 29.8 pg (ref 26.0–34.0)
MCHC: 32.7 g/dL (ref 30.0–36.0)
MCV: 91.2 fL (ref 80.0–100.0)
Platelets: 202 10*3/uL (ref 150–400)
RBC: 4.66 MIL/uL (ref 3.87–5.11)
RDW: 13.5 % (ref 11.5–15.5)
WBC: 7.9 10*3/uL (ref 4.0–10.5)
nRBC: 0 % (ref 0.0–0.2)

## 2020-02-19 LAB — URINALYSIS, ROUTINE W REFLEX MICROSCOPIC
Bilirubin Urine: NEGATIVE
Glucose, UA: NEGATIVE mg/dL
Ketones, ur: NEGATIVE mg/dL
Leukocytes,Ua: NEGATIVE
Nitrite: NEGATIVE
Protein, ur: NEGATIVE mg/dL
Specific Gravity, Urine: 1.018 (ref 1.005–1.030)
pH: 5 (ref 5.0–8.0)

## 2020-02-19 LAB — I-STAT BETA HCG BLOOD, ED (MC, WL, AP ONLY): I-stat hCG, quantitative: <5 m[IU]/mL (ref <5)

## 2020-02-19 LAB — LIPASE, BLOOD: Lipase: 34 U/L (ref 11–51)

## 2020-02-19 NOTE — ED Triage Notes (Signed)
Patient reports lower abd pain starting 2 weeks ago. Says it is worse when she uses bathroom or lays on right side. Pain in 8/10.

## 2020-02-19 NOTE — ED Notes (Signed)
Pt called with no answer x3. 

## 2021-01-06 IMAGING — US US TRANSVAGINAL NON-OB
1 series · 13 of 25 positions shown · non-contrast
Comparison: Ultrasound 08/26/2019

CLINICAL DATA: Pelvic pain

EXAM:
TRANSABDOMINAL AND TRANSVAGINAL ULTRASOUND OF PELVIS
DOPPLER ULTRASOUND OF OVARIES
TECHNIQUE: Both transabdominal and transvaginal ultrasound examinations of the
pelvis were performed. Transabdominal technique was performed for
global imaging of the pelvis including uterus, ovaries, adnexal
regions, and pelvic cul-de-sac.
It was necessary to proceed with endovaginal exam following the
transabdominal exam to visualize the adnexa. Color and duplex
Doppler ultrasound was utilized to evaluate blood flow to the
ovaries.

[Series 1: us transvaginal non-ob · 120 acquisitions, 13 frames shown]
[im 1/120]
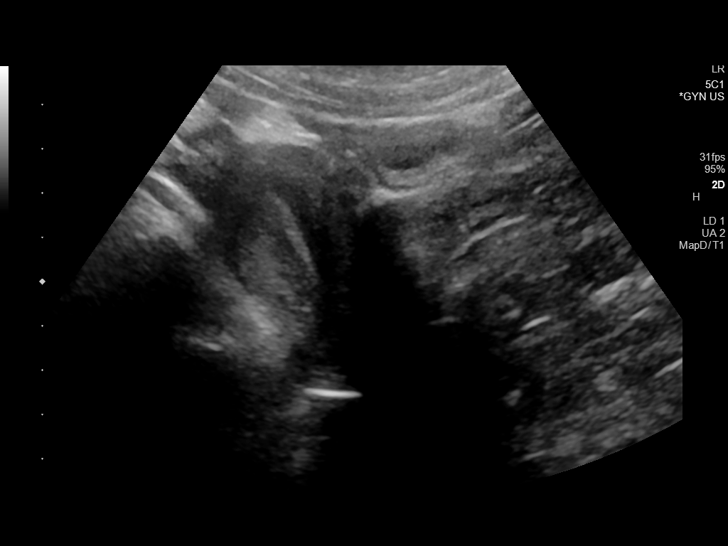
[im 10/120]
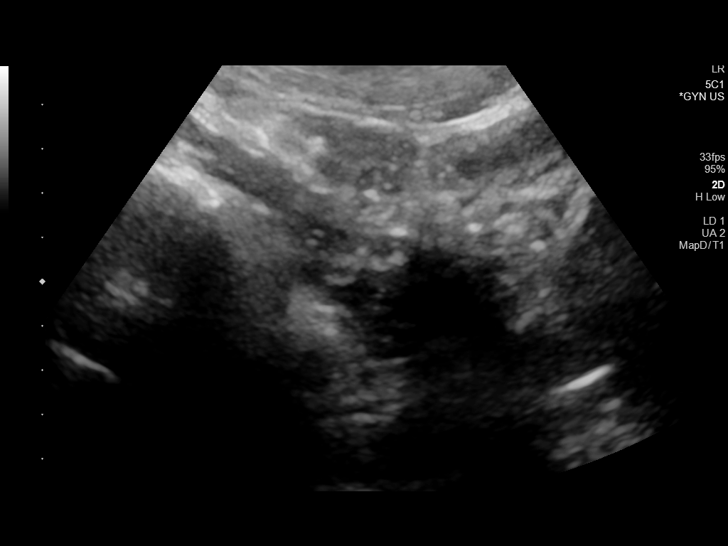
[im 20/120]
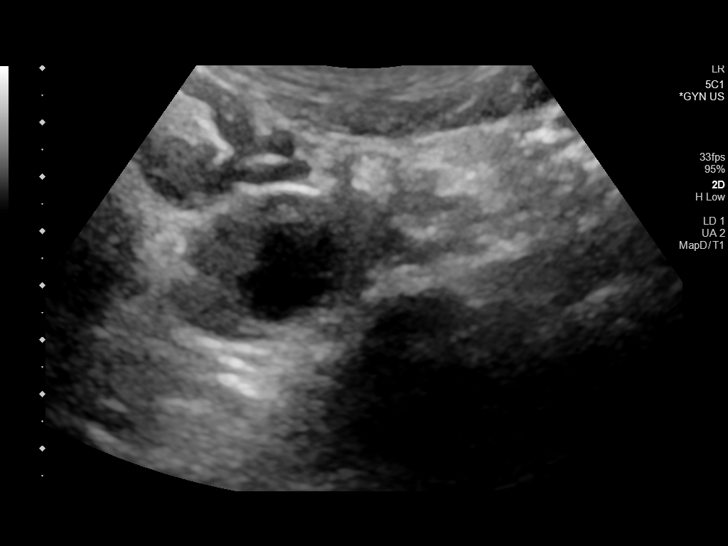
[im 30/120]
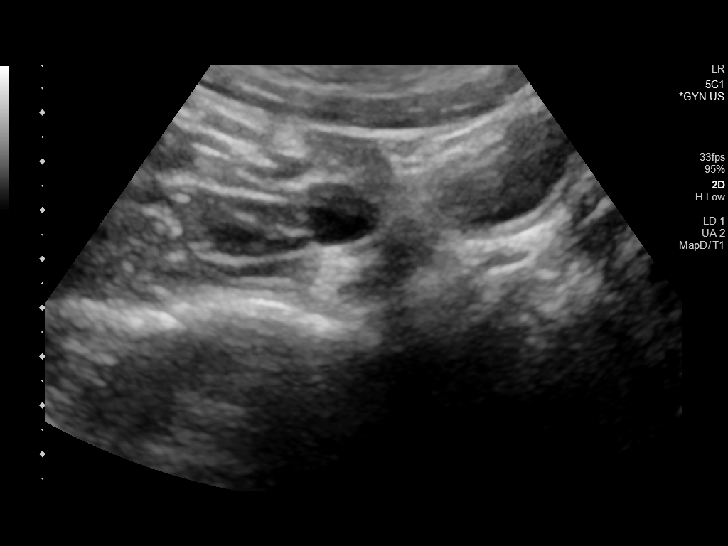
[im 40/120]
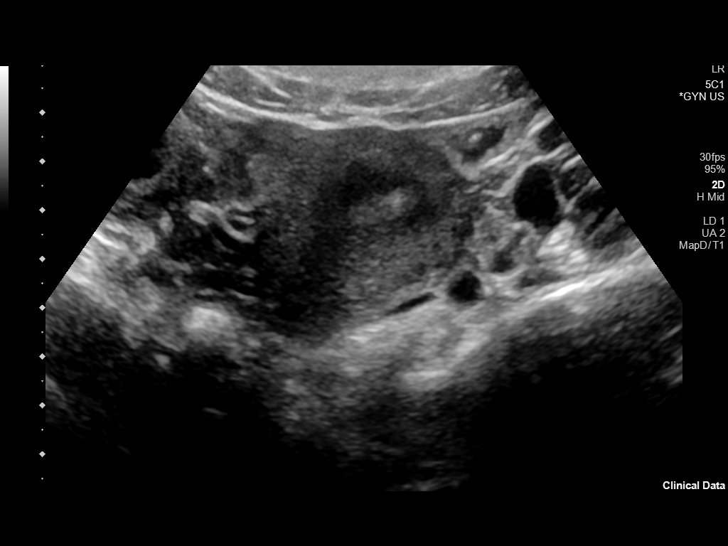
[im 50/120]
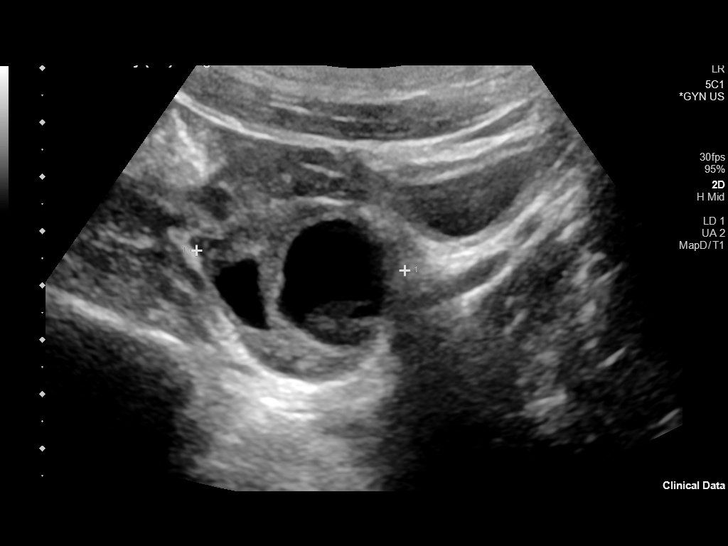
[im 60/120]
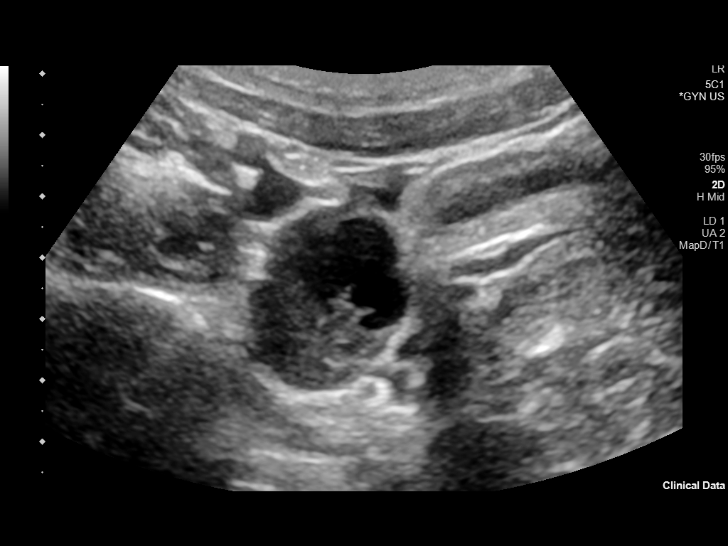
[im 70/120]
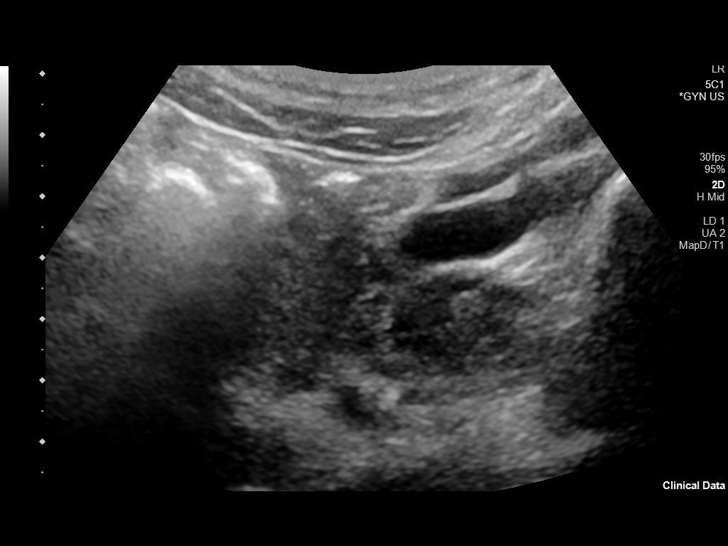
[im 80/120]
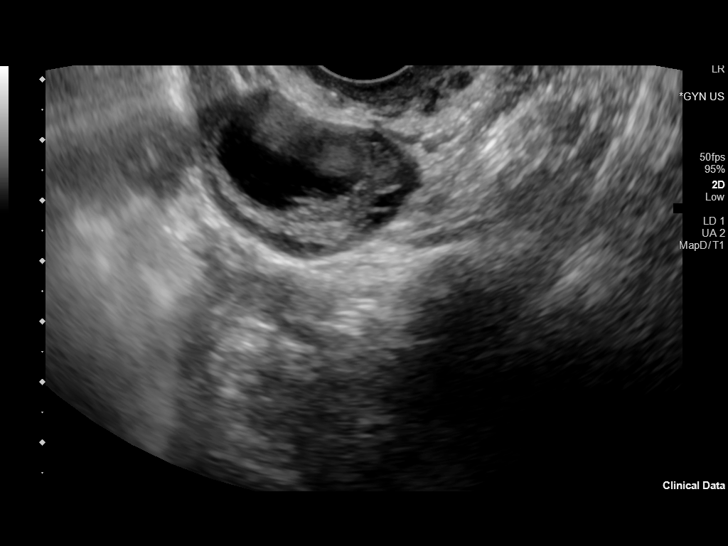
[im 90/120]
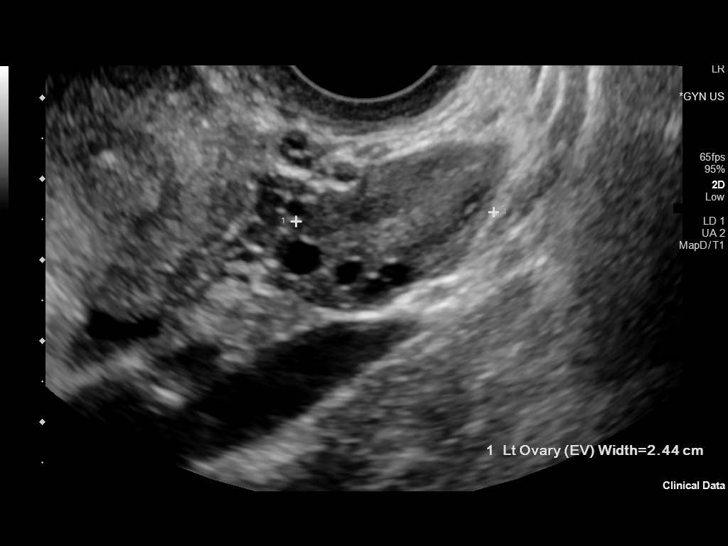
[im 100/120]
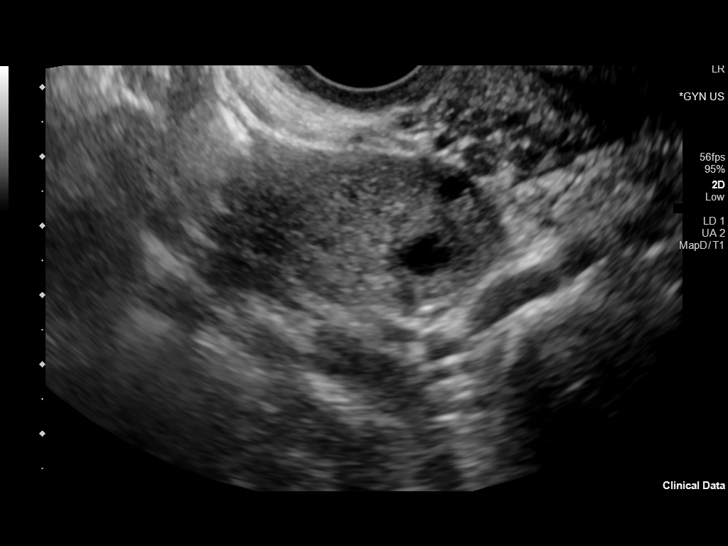
[im 110/120]
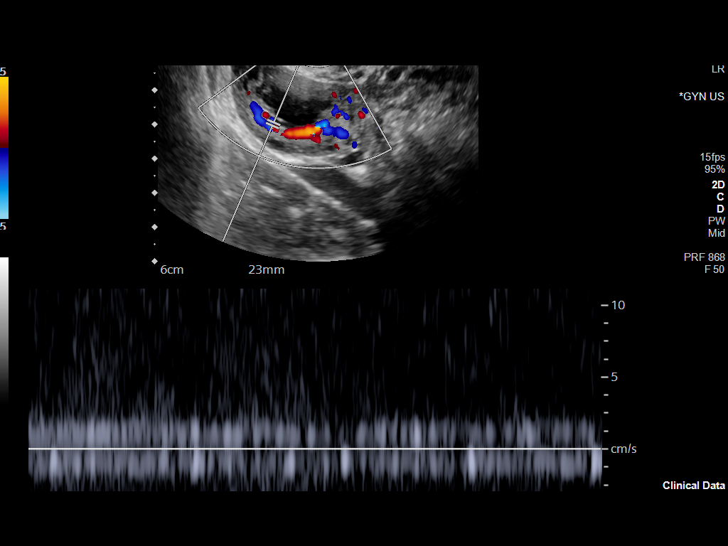
[im 120/120]
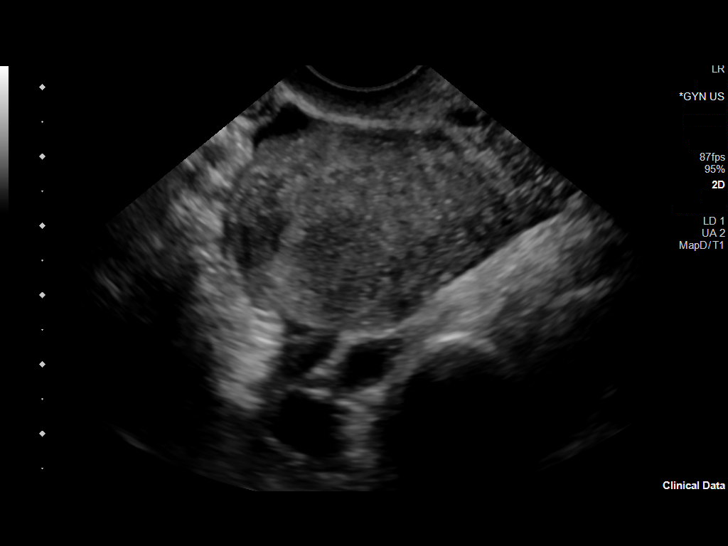

[13 of 25 positions shown; findings below may reference images not displayed]

FINDINGS: Uterus

Measurements: 7.1 x 3.0 x 4.1 cm = volume: 45 mL. No fibroids or
other mass visualized.

Endometrium

Thickness: 5 mm.  No focal abnormality visualized.

Right ovary

Measurements: 3.9 x 3.3 x 2.8 cm. 2.2 cm right ovarian cyst with
reticular internal echoes and a small eccentric solid-appearing area
without vascularity, most compatible with a hemorrhagic cyst. Right
ovary is otherwise normal in appearance. No adnexal mass.

Left ovary

Measurements: 2.7 x 2.4 x 2.8 cm. Normal appearance/no adnexal mass.

Pulsed Doppler evaluation of both ovaries demonstrates normal
low-resistance arterial and venous waveforms.

Other findings

Trace free fluid within the pelvis.
IMPRESSION: 1. Negative for adnexal torsion.
2. Complex 2.2 cm right ovarian cyst, with imaging features most
compatible with a hemorrhagic cyst. No imaging follow-up of this
finding is required. This recommendation follows the consensus
statement: Management of Asymptomatic Ovarian and Other Adnexal
Cysts Imaged at US: Society of [employer]iologists in Ultrasound Consensus

## 2021-01-06 IMAGING — US US PELVIS COMPLETE
1 series · 13 of 25 positions shown · non-contrast
Comparison: Ultrasound 08/26/2019

CLINICAL DATA: Pelvic pain

EXAM:
TRANSABDOMINAL AND TRANSVAGINAL ULTRASOUND OF PELVIS
DOPPLER ULTRASOUND OF OVARIES
TECHNIQUE: Both transabdominal and transvaginal ultrasound examinations of the
pelvis were performed. Transabdominal technique was performed for
global imaging of the pelvis including uterus, ovaries, adnexal
regions, and pelvic cul-de-sac.
It was necessary to proceed with endovaginal exam following the
transabdominal exam to visualize the adnexa. Color and duplex
Doppler ultrasound was utilized to evaluate blood flow to the
ovaries.

[Series 1: us pelvis complete · 119 acquisitions, 13 frames shown]
[im 1/119]
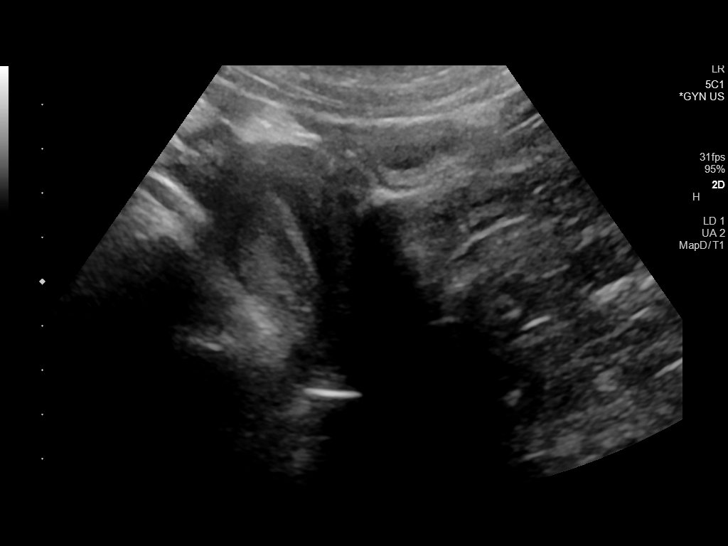
[im 10/119]
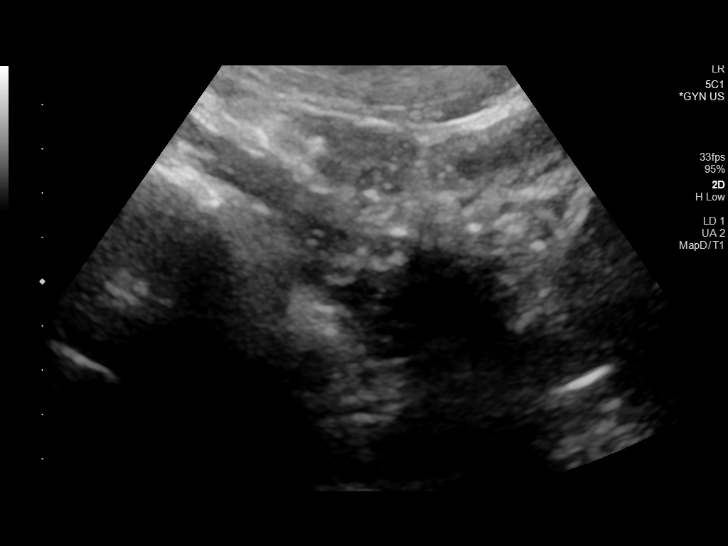
[im 20/119]
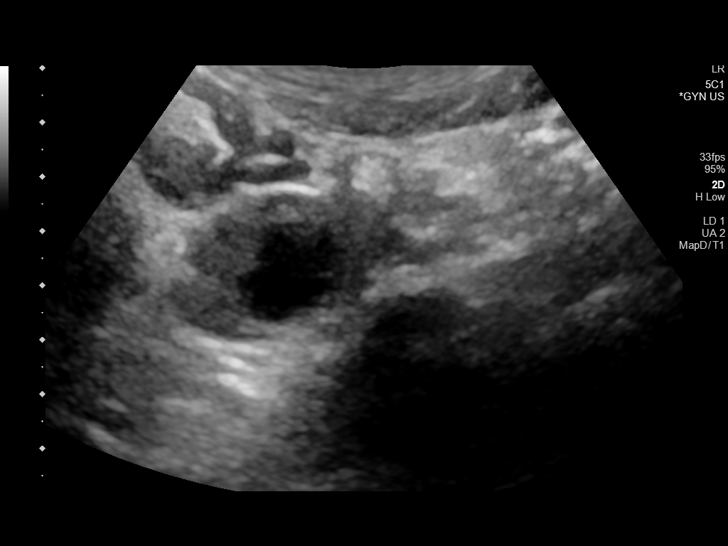
[im 30/119]
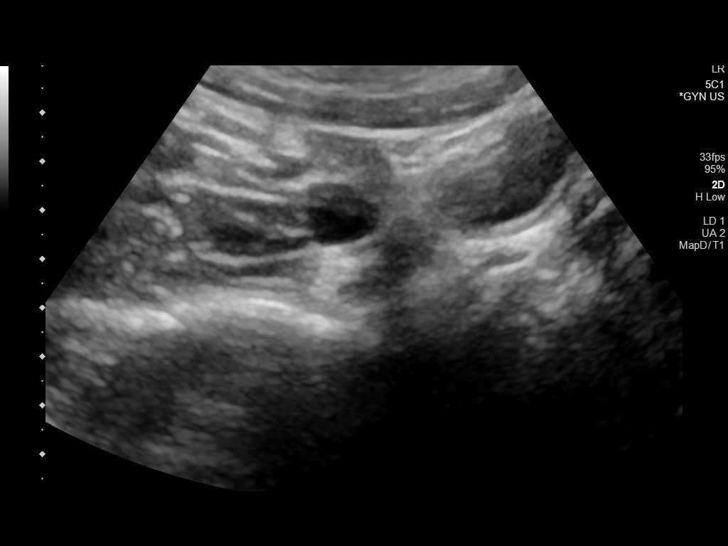
[im 40/119]
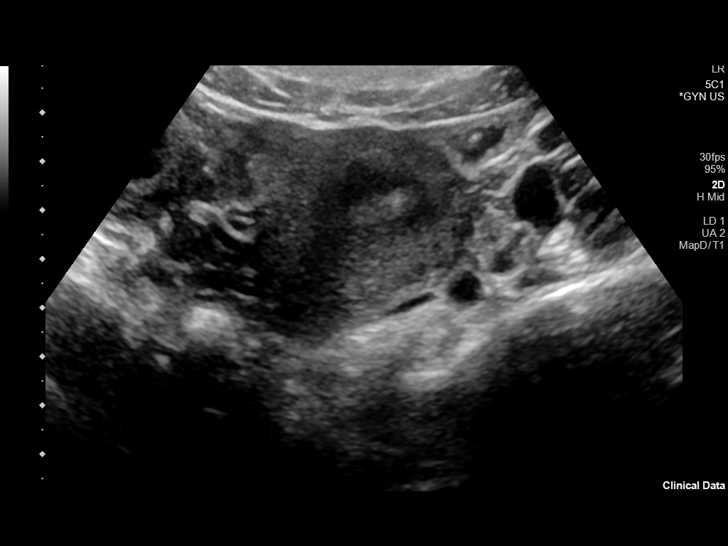
[im 50/119]
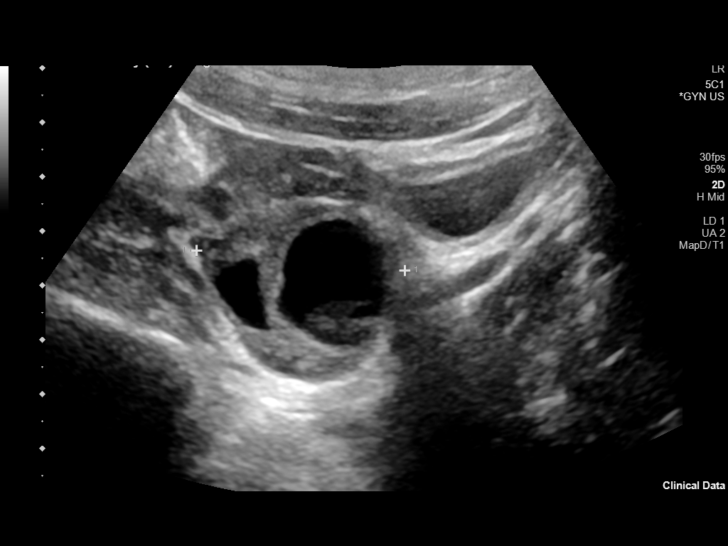
[im 60/119]
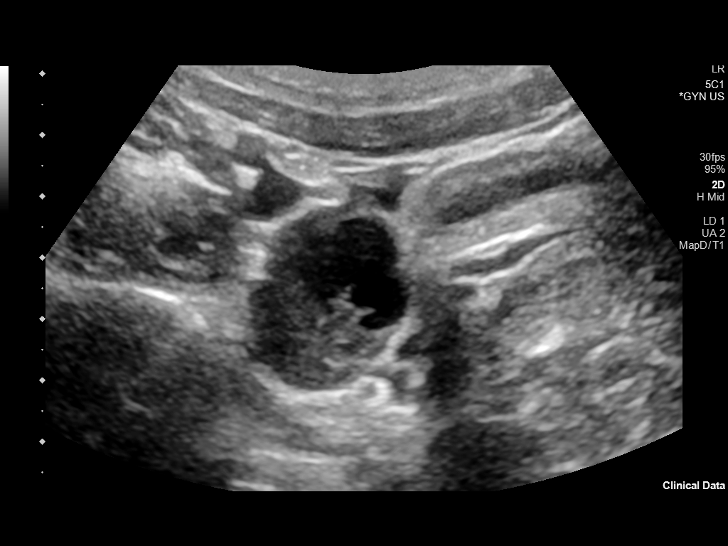
[im 69/119]
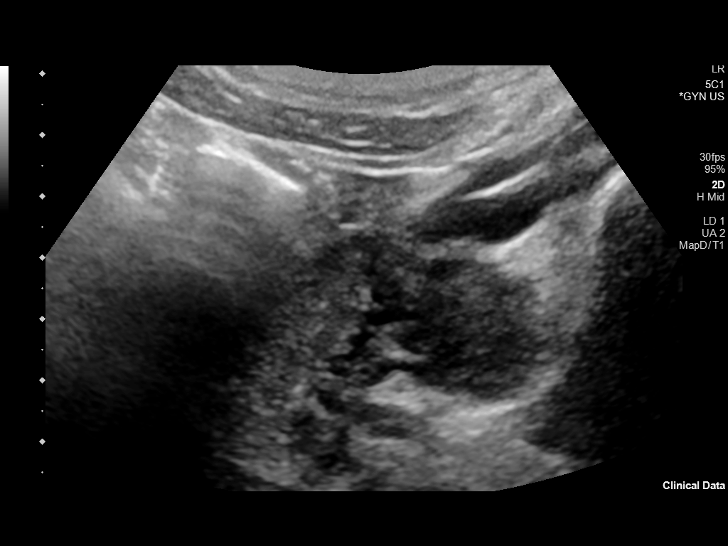
[im 79/119]
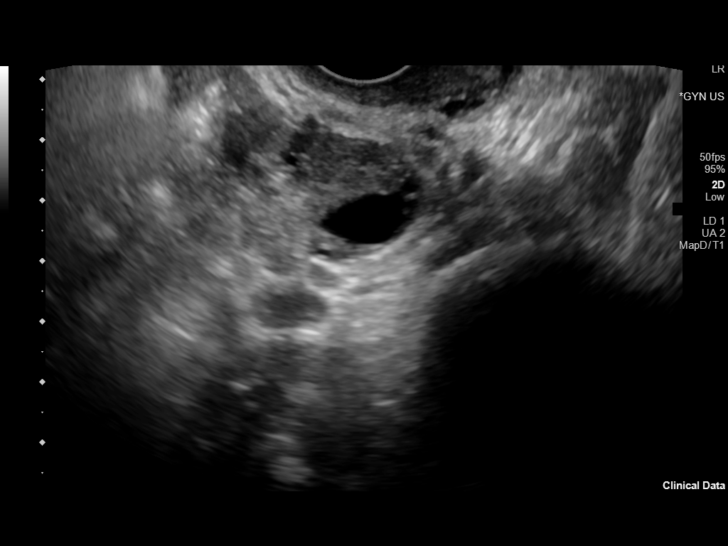
[im 89/119]
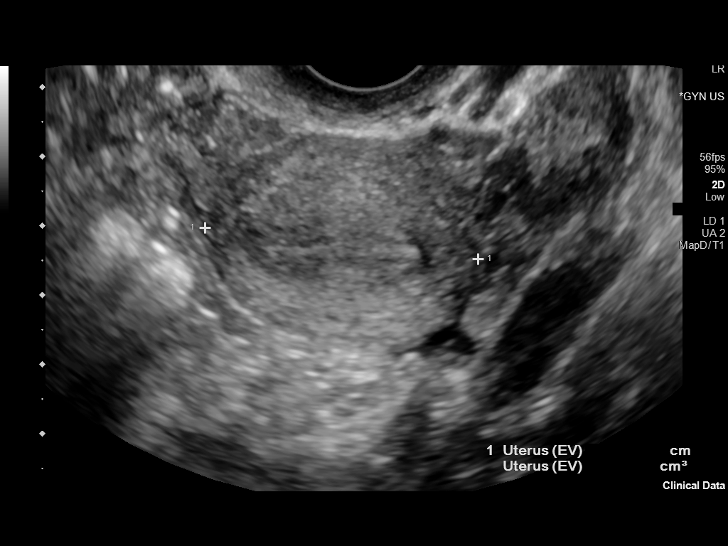
[im 99/119]
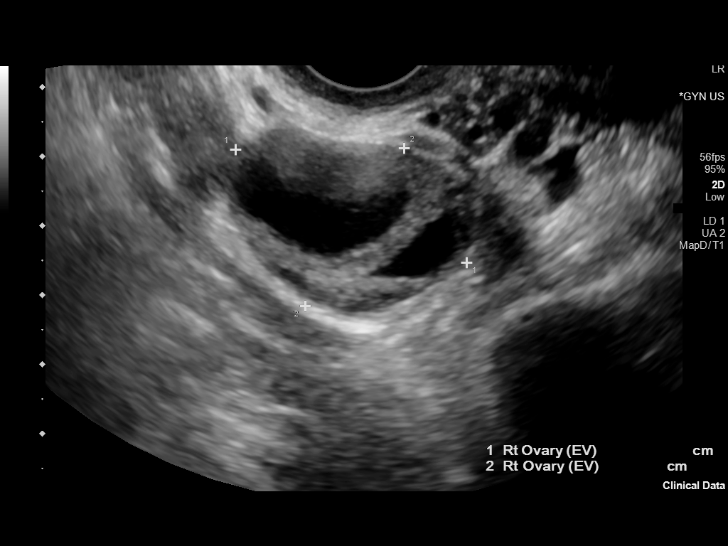
[im 109/119]
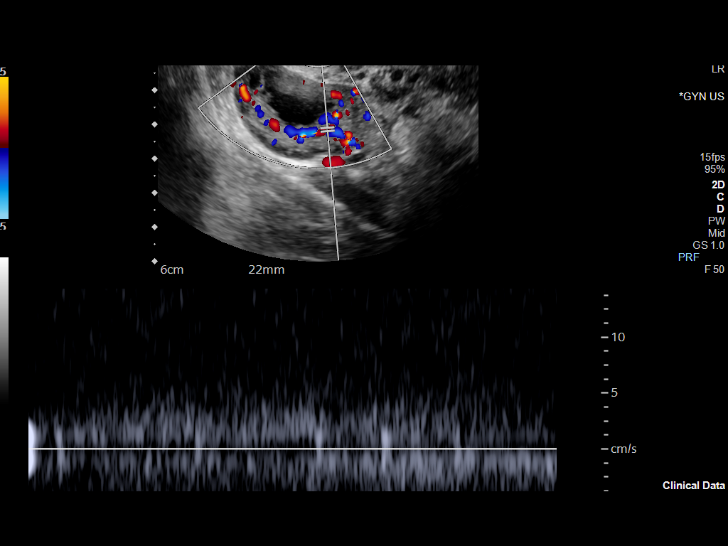
[im 119/119]
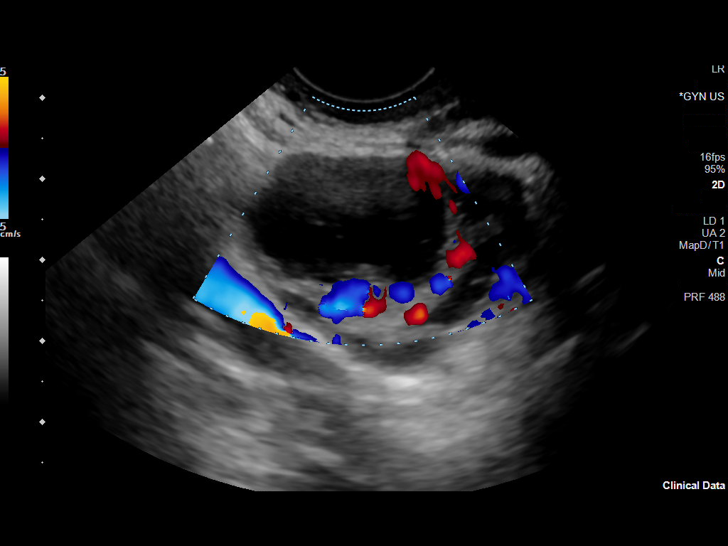

[13 of 25 positions shown; findings below may reference images not displayed]

FINDINGS: Uterus

Measurements: 7.1 x 3.0 x 4.1 cm = volume: 45 mL. No fibroids or
other mass visualized.

Endometrium

Thickness: 5 mm.  No focal abnormality visualized.

Right ovary

Measurements: 3.9 x 3.3 x 2.8 cm. 2.2 cm right ovarian cyst with
reticular internal echoes and a small eccentric solid-appearing area
without vascularity, most compatible with a hemorrhagic cyst. Right
ovary is otherwise normal in appearance. No adnexal mass.

Left ovary

Measurements: 2.7 x 2.4 x 2.8 cm. Normal appearance/no adnexal mass.

Pulsed Doppler evaluation of both ovaries demonstrates normal
low-resistance arterial and venous waveforms.

Other findings

Trace free fluid within the pelvis.
IMPRESSION: 1. Negative for adnexal torsion.
2. Complex 2.2 cm right ovarian cyst, with imaging features most
compatible with a hemorrhagic cyst. No imaging follow-up of this
finding is required. This recommendation follows the consensus
statement: Management of Asymptomatic Ovarian and Other Adnexal
Cysts Imaged at US: Society of Radiologists in Ultrasound Consensus
